# Patient Record
Sex: Female | Born: 1940 | Race: White | Hispanic: No | Smoking: Never smoker
Health system: Southern US, Community
[De-identification: ages and names within clinical notes are randomized; demographics above are authoritative.]

## PROBLEM LIST (undated history)

## (undated) DIAGNOSIS — I38 Endocarditis, valve unspecified: Secondary | ICD-10-CM

## (undated) DIAGNOSIS — C189 Malignant neoplasm of colon, unspecified: Secondary | ICD-10-CM

## (undated) DIAGNOSIS — D329 Benign neoplasm of meninges, unspecified: Secondary | ICD-10-CM

## (undated) DIAGNOSIS — I429 Cardiomyopathy, unspecified: Secondary | ICD-10-CM

## (undated) DIAGNOSIS — K5730 Diverticulosis of large intestine without perforation or abscess without bleeding: Secondary | ICD-10-CM

## (undated) DIAGNOSIS — F32A Depression, unspecified: Secondary | ICD-10-CM

## (undated) DIAGNOSIS — F419 Anxiety disorder, unspecified: Secondary | ICD-10-CM

## (undated) DIAGNOSIS — E039 Hypothyroidism, unspecified: Secondary | ICD-10-CM

## (undated) DIAGNOSIS — I10 Essential (primary) hypertension: Secondary | ICD-10-CM

## (undated) DIAGNOSIS — I5022 Chronic systolic (congestive) heart failure: Secondary | ICD-10-CM

## (undated) DIAGNOSIS — I700 Atherosclerosis of aorta: Secondary | ICD-10-CM

## (undated) DIAGNOSIS — D496 Neoplasm of unspecified behavior of brain: Secondary | ICD-10-CM

## (undated) DIAGNOSIS — I428 Other cardiomyopathies: Secondary | ICD-10-CM

## (undated) DIAGNOSIS — D509 Iron deficiency anemia, unspecified: Secondary | ICD-10-CM

## (undated) DIAGNOSIS — M810 Age-related osteoporosis without current pathological fracture: Secondary | ICD-10-CM

## (undated) DIAGNOSIS — N6019 Diffuse cystic mastopathy of unspecified breast: Secondary | ICD-10-CM

## (undated) DIAGNOSIS — E785 Hyperlipidemia, unspecified: Secondary | ICD-10-CM

## (undated) DIAGNOSIS — J3801 Paralysis of vocal cords and larynx, unilateral: Secondary | ICD-10-CM

## (undated) DIAGNOSIS — K219 Gastro-esophageal reflux disease without esophagitis: Secondary | ICD-10-CM

## (undated) HISTORY — PX: BREAST EXCISIONAL BIOPSY: SUR124

## (undated) HISTORY — PX: COLONOSCOPY: SHX174

## (undated) HISTORY — PX: HEMORROIDECTOMY: SUR656

## (undated) HISTORY — PX: BREAST SURGERY: SHX581

## (undated) HISTORY — PX: CATARACT EXTRACTION, BILATERAL: SHX1313

## (undated) HISTORY — PX: APPENDECTOMY: SHX54

## (undated) HISTORY — PX: EYE SURGERY: SHX253

## (undated) HISTORY — PX: SHOULDER ARTHROSCOPY: SHX128

---

## 1983-01-02 HISTORY — PX: OTHER SURGICAL HISTORY: SHX169

## 2003-12-21 ENCOUNTER — Ambulatory Visit: Payer: Self-pay | Admitting: Internal Medicine

## 2004-01-07 ENCOUNTER — Ambulatory Visit: Payer: Self-pay | Admitting: Internal Medicine

## 2004-04-01 HISTORY — PX: OTHER SURGICAL HISTORY: SHX169

## 2004-08-24 ENCOUNTER — Ambulatory Visit: Payer: Self-pay | Admitting: Otolaryngology

## 2004-12-27 ENCOUNTER — Ambulatory Visit: Payer: Self-pay | Admitting: Internal Medicine

## 2005-06-29 ENCOUNTER — Ambulatory Visit: Payer: Self-pay

## 2005-12-06 ENCOUNTER — Ambulatory Visit: Payer: Self-pay | Admitting: Internal Medicine

## 2006-06-06 ENCOUNTER — Ambulatory Visit: Payer: Self-pay | Admitting: Gastroenterology

## 2006-12-24 ENCOUNTER — Ambulatory Visit: Payer: Self-pay | Admitting: Internal Medicine

## 2007-07-07 ENCOUNTER — Ambulatory Visit: Payer: Self-pay | Admitting: Otolaryngology

## 2008-01-01 ENCOUNTER — Ambulatory Visit: Payer: Self-pay | Admitting: Internal Medicine

## 2009-05-04 ENCOUNTER — Ambulatory Visit: Payer: Self-pay | Admitting: Internal Medicine

## 2009-11-17 ENCOUNTER — Ambulatory Visit: Payer: Self-pay | Admitting: Ophthalmology

## 2009-11-29 ENCOUNTER — Ambulatory Visit: Payer: Self-pay | Admitting: Ophthalmology

## 2010-03-07 ENCOUNTER — Ambulatory Visit: Payer: Self-pay | Admitting: Ophthalmology

## 2010-08-01 ENCOUNTER — Ambulatory Visit: Payer: Self-pay | Admitting: Internal Medicine

## 2011-08-14 ENCOUNTER — Ambulatory Visit: Payer: Self-pay | Admitting: Internal Medicine

## 2012-08-14 ENCOUNTER — Ambulatory Visit: Payer: Self-pay | Admitting: Internal Medicine

## 2012-09-22 ENCOUNTER — Ambulatory Visit: Payer: Self-pay | Admitting: Otolaryngology

## 2013-06-11 DIAGNOSIS — M810 Age-related osteoporosis without current pathological fracture: Secondary | ICD-10-CM | POA: Insufficient documentation

## 2013-07-01 DIAGNOSIS — E785 Hyperlipidemia, unspecified: Secondary | ICD-10-CM | POA: Insufficient documentation

## 2013-08-17 ENCOUNTER — Ambulatory Visit: Payer: Self-pay | Admitting: Internal Medicine

## 2014-01-05 DIAGNOSIS — E038 Other specified hypothyroidism: Secondary | ICD-10-CM | POA: Diagnosis not present

## 2014-01-05 DIAGNOSIS — E034 Atrophy of thyroid (acquired): Secondary | ICD-10-CM | POA: Diagnosis not present

## 2015-03-08 DIAGNOSIS — E7800 Pure hypercholesterolemia, unspecified: Secondary | ICD-10-CM | POA: Diagnosis not present

## 2015-03-08 DIAGNOSIS — E034 Atrophy of thyroid (acquired): Secondary | ICD-10-CM | POA: Diagnosis not present

## 2015-03-15 DIAGNOSIS — Z1231 Encounter for screening mammogram for malignant neoplasm of breast: Secondary | ICD-10-CM | POA: Diagnosis not present

## 2015-03-15 DIAGNOSIS — M810 Age-related osteoporosis without current pathological fracture: Secondary | ICD-10-CM | POA: Diagnosis not present

## 2015-03-15 DIAGNOSIS — Z0001 Encounter for general adult medical examination with abnormal findings: Secondary | ICD-10-CM | POA: Diagnosis not present

## 2015-03-15 DIAGNOSIS — Z8639 Personal history of other endocrine, nutritional and metabolic disease: Secondary | ICD-10-CM | POA: Diagnosis not present

## 2015-03-21 ENCOUNTER — Other Ambulatory Visit: Payer: Self-pay | Admitting: Internal Medicine

## 2015-03-21 DIAGNOSIS — Z1231 Encounter for screening mammogram for malignant neoplasm of breast: Secondary | ICD-10-CM

## 2015-03-29 DIAGNOSIS — M810 Age-related osteoporosis without current pathological fracture: Secondary | ICD-10-CM | POA: Diagnosis not present

## 2015-04-01 ENCOUNTER — Ambulatory Visit
Admission: RE | Admit: 2015-04-01 | Discharge: 2015-04-01 | Disposition: A | Discharge status: Home / Self Care | Payer: Commercial Managed Care - HMO | Source: Ambulatory Visit | Attending: Internal Medicine | Admitting: Internal Medicine

## 2015-04-01 ENCOUNTER — Other Ambulatory Visit: Payer: Self-pay | Admitting: Internal Medicine

## 2015-04-01 DIAGNOSIS — Z1231 Encounter for screening mammogram for malignant neoplasm of breast: Secondary | ICD-10-CM | POA: Diagnosis not present

## 2015-09-08 DIAGNOSIS — Z8639 Personal history of other endocrine, nutritional and metabolic disease: Secondary | ICD-10-CM | POA: Diagnosis not present

## 2015-09-15 DIAGNOSIS — E034 Atrophy of thyroid (acquired): Secondary | ICD-10-CM | POA: Diagnosis not present

## 2015-11-30 DIAGNOSIS — Z0000 Encounter for general adult medical examination without abnormal findings: Secondary | ICD-10-CM | POA: Diagnosis not present

## 2016-03-08 DIAGNOSIS — E034 Atrophy of thyroid (acquired): Secondary | ICD-10-CM | POA: Diagnosis not present

## 2016-03-15 DIAGNOSIS — Z0001 Encounter for general adult medical examination with abnormal findings: Secondary | ICD-10-CM | POA: Diagnosis not present

## 2016-03-15 DIAGNOSIS — E034 Atrophy of thyroid (acquired): Secondary | ICD-10-CM | POA: Diagnosis not present

## 2016-03-15 DIAGNOSIS — Z0000 Encounter for general adult medical examination without abnormal findings: Secondary | ICD-10-CM | POA: Diagnosis not present

## 2016-03-15 DIAGNOSIS — Z1231 Encounter for screening mammogram for malignant neoplasm of breast: Secondary | ICD-10-CM | POA: Diagnosis not present

## 2016-03-15 DIAGNOSIS — E7800 Pure hypercholesterolemia, unspecified: Secondary | ICD-10-CM | POA: Diagnosis not present

## 2016-03-15 DIAGNOSIS — M810 Age-related osteoporosis without current pathological fracture: Secondary | ICD-10-CM | POA: Diagnosis not present

## 2016-04-09 ENCOUNTER — Other Ambulatory Visit: Payer: Self-pay | Admitting: Internal Medicine

## 2016-04-09 DIAGNOSIS — Z1231 Encounter for screening mammogram for malignant neoplasm of breast: Secondary | ICD-10-CM

## 2016-04-11 ENCOUNTER — Ambulatory Visit
Admission: RE | Admit: 2016-04-11 | Discharge: 2016-04-11 | Disposition: A | Discharge status: Home / Self Care | Payer: Commercial Managed Care - HMO | Source: Ambulatory Visit | Attending: Internal Medicine | Admitting: Internal Medicine

## 2016-04-11 DIAGNOSIS — Z1231 Encounter for screening mammogram for malignant neoplasm of breast: Secondary | ICD-10-CM | POA: Diagnosis not present

## 2016-06-28 DIAGNOSIS — H43813 Vitreous degeneration, bilateral: Secondary | ICD-10-CM | POA: Diagnosis not present

## 2016-08-14 DIAGNOSIS — Z23 Encounter for immunization: Secondary | ICD-10-CM | POA: Diagnosis not present

## 2016-08-14 DIAGNOSIS — S61402A Unspecified open wound of left hand, initial encounter: Secondary | ICD-10-CM | POA: Diagnosis not present

## 2016-08-14 DIAGNOSIS — W5501XA Bitten by cat, initial encounter: Secondary | ICD-10-CM | POA: Diagnosis not present

## 2016-08-14 DIAGNOSIS — S81801A Unspecified open wound, right lower leg, initial encounter: Secondary | ICD-10-CM | POA: Diagnosis not present

## 2016-08-31 DIAGNOSIS — R300 Dysuria: Secondary | ICD-10-CM | POA: Diagnosis not present

## 2016-08-31 DIAGNOSIS — R319 Hematuria, unspecified: Secondary | ICD-10-CM | POA: Diagnosis not present

## 2016-08-31 DIAGNOSIS — N390 Urinary tract infection, site not specified: Secondary | ICD-10-CM | POA: Diagnosis not present

## 2016-09-14 DIAGNOSIS — E034 Atrophy of thyroid (acquired): Secondary | ICD-10-CM | POA: Diagnosis not present

## 2016-09-18 DIAGNOSIS — E034 Atrophy of thyroid (acquired): Secondary | ICD-10-CM | POA: Diagnosis not present

## 2017-01-15 DIAGNOSIS — F411 Generalized anxiety disorder: Secondary | ICD-10-CM | POA: Diagnosis not present

## 2017-01-15 DIAGNOSIS — E034 Atrophy of thyroid (acquired): Secondary | ICD-10-CM | POA: Diagnosis not present

## 2017-03-12 DIAGNOSIS — E034 Atrophy of thyroid (acquired): Secondary | ICD-10-CM | POA: Diagnosis not present

## 2017-04-01 DIAGNOSIS — Z5181 Encounter for therapeutic drug level monitoring: Secondary | ICD-10-CM | POA: Diagnosis not present

## 2017-04-01 DIAGNOSIS — Z1231 Encounter for screening mammogram for malignant neoplasm of breast: Secondary | ICD-10-CM | POA: Diagnosis not present

## 2017-04-01 DIAGNOSIS — M810 Age-related osteoporosis without current pathological fracture: Secondary | ICD-10-CM | POA: Diagnosis not present

## 2017-04-01 DIAGNOSIS — E034 Atrophy of thyroid (acquired): Secondary | ICD-10-CM | POA: Diagnosis not present

## 2017-04-01 DIAGNOSIS — Z0001 Encounter for general adult medical examination with abnormal findings: Secondary | ICD-10-CM | POA: Diagnosis not present

## 2017-04-01 DIAGNOSIS — Z0000 Encounter for general adult medical examination without abnormal findings: Secondary | ICD-10-CM | POA: Diagnosis not present

## 2017-04-01 DIAGNOSIS — E7800 Pure hypercholesterolemia, unspecified: Secondary | ICD-10-CM | POA: Diagnosis not present

## 2017-04-01 DIAGNOSIS — F411 Generalized anxiety disorder: Secondary | ICD-10-CM | POA: Diagnosis not present

## 2017-04-15 ENCOUNTER — Other Ambulatory Visit: Payer: Self-pay | Admitting: Internal Medicine

## 2017-04-15 DIAGNOSIS — Z1231 Encounter for screening mammogram for malignant neoplasm of breast: Secondary | ICD-10-CM

## 2017-05-28 ENCOUNTER — Ambulatory Visit
Admission: RE | Admit: 2017-05-28 | Discharge: 2017-05-28 | Disposition: A | Discharge status: Home / Self Care | Payer: Medicare HMO | Source: Ambulatory Visit | Attending: Internal Medicine | Admitting: Internal Medicine

## 2017-05-28 DIAGNOSIS — Z1231 Encounter for screening mammogram for malignant neoplasm of breast: Secondary | ICD-10-CM | POA: Diagnosis not present

## 2017-06-07 DIAGNOSIS — F32A Depression, unspecified: Secondary | ICD-10-CM | POA: Insufficient documentation

## 2017-06-07 DIAGNOSIS — F329 Major depressive disorder, single episode, unspecified: Secondary | ICD-10-CM | POA: Diagnosis not present

## 2017-06-07 DIAGNOSIS — F411 Generalized anxiety disorder: Secondary | ICD-10-CM | POA: Diagnosis not present

## 2017-06-08 ENCOUNTER — Emergency Department
Admission: EM | Admit: 2017-06-08 | Discharge: 2017-06-08 | Disposition: A | Discharge status: Home / Self Care | Payer: Medicare HMO | Attending: Emergency Medicine | Admitting: Emergency Medicine

## 2017-06-08 ENCOUNTER — Encounter: Payer: Self-pay | Admitting: Emergency Medicine

## 2017-06-08 ENCOUNTER — Other Ambulatory Visit: Payer: Self-pay

## 2017-06-08 DIAGNOSIS — R300 Dysuria: Secondary | ICD-10-CM | POA: Diagnosis present

## 2017-06-08 DIAGNOSIS — N3001 Acute cystitis with hematuria: Secondary | ICD-10-CM

## 2017-06-08 LAB — CBC
HCT: 37.9 % (ref 35.0–47.0)
HEMOGLOBIN: 12.9 g/dL (ref 12.0–16.0)
MCH: 32 pg (ref 26.0–34.0)
MCHC: 34 g/dL (ref 32.0–36.0)
MCV: 94 fL (ref 80.0–100.0)
Platelets: 260 10*3/uL (ref 150–440)
RBC: 4.03 MIL/uL (ref 3.80–5.20)
RDW: 13.6 % (ref 11.5–14.5)
WBC: 9.5 10*3/uL (ref 3.6–11.0)

## 2017-06-08 LAB — URINALYSIS, COMPLETE (UACMP) WITH MICROSCOPIC
BILIRUBIN URINE: NEGATIVE
GLUCOSE, UA: NEGATIVE mg/dL
KETONES UR: NEGATIVE mg/dL
NITRITE: NEGATIVE
PH: 5 (ref 5.0–8.0)
Protein, ur: 100 mg/dL — AB
RBC / HPF: >50 RBC/hpf — ABNORMAL HIGH (ref 0–5)
SPECIFIC GRAVITY, URINE: 1.012 (ref 1.005–1.030)
Squamous Epithelial / LPF: NONE SEEN (ref 0–5)

## 2017-06-08 LAB — COMPREHENSIVE METABOLIC PANEL
ALBUMIN: 4.2 g/dL (ref 3.5–5.0)
ALT: 14 U/L (ref 14–54)
ANION GAP: 8 (ref 5–15)
AST: 22 U/L (ref 15–41)
Alkaline Phosphatase: 50 U/L (ref 38–126)
BILIRUBIN TOTAL: 0.6 mg/dL (ref 0.3–1.2)
BUN: 14 mg/dL (ref 6–20)
CO2: 25 mmol/L (ref 22–32)
CREATININE: 0.88 mg/dL (ref 0.44–1.00)
Calcium: 9.1 mg/dL (ref 8.9–10.3)
Chloride: 105 mmol/L (ref 101–111)
GFR calc non Af Amer: >60 mL/min (ref >60)
GLUCOSE: 102 mg/dL — AB (ref 65–99)
Potassium: 4.1 mmol/L (ref 3.5–5.1)
Sodium: 138 mmol/L (ref 135–145)
TOTAL PROTEIN: 7.4 g/dL (ref 6.5–8.1)

## 2017-06-08 LAB — LIPASE, BLOOD: Lipase: 73 U/L — ABNORMAL HIGH (ref 11–51)

## 2017-06-08 MED ORDER — CEPHALEXIN 500 MG PO CAPS: 500.0000 mg | ORAL_CAPSULE | Freq: Four times a day (QID) | ORAL | 0 refills | Status: DC

## 2017-06-08 MED ORDER — PHENAZOPYRIDINE HCL 200 MG PO TABS
ORAL_TABLET | ORAL | Status: AC
  Filled 2017-06-08: qty 1

## 2017-06-08 MED ORDER — PHENAZOPYRIDINE HCL 95 MG PO TABS: 95.0000 mg | ORAL_TABLET | Freq: Three times a day (TID) | ORAL | 0 refills | Status: DC | PRN

## 2017-06-08 MED ORDER — CEPHALEXIN 500 MG PO CAPS
500.0000 mg | ORAL_CAPSULE | Freq: Once | ORAL | Status: AC
  Administered 2017-06-08: 500 mg via ORAL
  Filled 2017-06-08: qty 1

## 2017-06-08 NOTE — ED Triage Notes (Signed)
Urinary frequency and pain x 2 days, took AZO and increased fluids and symptoms decreased however today noted blood in urine and symptoms return.

## 2017-06-08 NOTE — ED Provider Notes (Signed)
[facility] Emergency Department Provider Note  ____________________________________________  Time seen: Approximately 6:22 PM  I have reviewed the triage vital signs and the nursing notes.   HISTORY  Chief Complaint Dysuria    HPI Cynthia Mcgrath is a 77 y.o. female who presents the emergency department complaining of UTI-like symptoms.  Patient presents planing of dysuria.  He states that symptoms began 4 days ago, she took Azo for same with improvement.  Patient reports that she had a visit with her primary care, mention same but was currently asymptomatic when she had seen her primary care provider.  No testing was performed at that time.  Patient had return of symptoms yesterday and start of hematuria today.  Last UTI was 6 months ago and patient cannot remember an biotic choice used.  Patient reports no flank pain, abdominal pain, pelvic pain.  She reports that there is pain only with urination.  Patient denies any other complaints of fevers or chills, chest pain, shortness of breath, abdominal pain, nausea vomiting, diarrhea or constipation.    History reviewed. No pertinent past medical history.  There are no active problems to display for this patient.   Past Surgical History:  Procedure Laterality Date  . BREAST EXCISIONAL BIOPSY Bilateral 1980's   NEG    Prior to Admission medications   Medication Sig Start Date End Date Taking? Authorizing Provider  cephALEXin (KEFLEX) 500 MG capsule Take 1 capsule (500 mg total) by mouth 4 (four) times daily. 06/08/17   Mayco Walrond, Charline Bills, PA-C  phenazopyridine (PYRIDIUM) 95 MG tablet Take 1 tablet (95 mg total) by mouth 3 (three) times daily as needed for pain. 06/08/17   Tyquasia Pant, Charline Bills, PA-C    Allergies Codeine  Family History  Problem Relation Age of Onset  . Breast cancer Cousin 78    Social History Social History   Tobacco Use  . Smoking status: Not on file  Substance Use Topics  .  Alcohol use: Not on file  . Drug use: Not on file     Review of Systems  Constitutional: No fever/chills Eyes: No visual changes. No discharge ENT: No upper respiratory complaints. Cardiovascular: no chest pain. Respiratory: no cough. No SOB. Gastrointestinal: No abdominal pain.  No nausea, no vomiting.  No diarrhea.  No constipation. Genitourinary: Positive for dysuria, polyuria, hematuria. Musculoskeletal: Negative for musculoskeletal pain. Skin: Negative for rash, abrasions, lacerations, ecchymosis. Neurological: Negative for headaches, focal weakness or numbness. 10-point ROS otherwise negative.  ____________________________________________   PHYSICAL EXAM:  VITAL SIGNS: ED Triage Vitals  Enc Vitals Group     BP 06/08/17 1606 121/72     Pulse Rate 06/08/17 1606 (!) 101     Resp 06/08/17 1606 20     Temp 06/08/17 1606 98.1 F (36.7 C)     Temp Source 06/08/17 1606 Oral     SpO2 06/08/17 1606 96 %     Weight 06/08/17 1607 130 lb (59 kg)     Height 06/08/17 1607 5' (1.524 m)     Head Circumference --      Peak Flow --      Pain Score 06/08/17 1606 0     Pain Loc --      Pain Edu? --      Excl. in [place]? --      Constitutional: Alert and oriented. Well appearing and in no acute distress. Eyes: Conjunctivae are normal. PERRL. EOMI. Head: Atraumatic. Neck: No stridor.    Cardiovascular: Normal rate,  regular rhythm. Normal S1 and S2.  Good peripheral circulation. Respiratory: Normal respiratory effort without tachypnea or retractions. Lungs CTAB. Good air entry to the bases with no decreased or absent breath sounds. Gastrointestinal: Bowel sounds 4 quadrants. Soft and nontender to palpation all 4 quadrants and suprapubic region.. No guarding or rigidity. No palpable masses. No distention. No CVA tenderness. Musculoskeletal: Full range of motion to all extremities. No gross deformities appreciated. Neurologic:  Normal speech and language. No gross focal neurologic  deficits are appreciated.  Skin:  Skin is warm, dry and intact. No rash noted. Psychiatric: Mood and affect are normal. Speech and behavior are normal. Patient exhibits appropriate insight and judgement.   ____________________________________________   LABS (all labs ordered are listed, but only abnormal results are displayed)  Labs Reviewed  URINALYSIS, COMPLETE (UACMP) WITH MICROSCOPIC - Abnormal; Notable for the following components:      Result Value   Color, Urine YELLOW (*)    APPearance CLOUDY (*)    Hgb urine dipstick LARGE (*)    Protein, ur 100 (*)    Leukocytes, UA LARGE (*)    RBC / HPF >50 (*)    WBC, UA >50 (*)    Bacteria, UA RARE (*)    Non Squamous Epithelial 0-5 (*)    All other components within normal limits  LIPASE, BLOOD - Abnormal; Notable for the following components:   Lipase 73 (*)    All other components within normal limits  COMPREHENSIVE METABOLIC PANEL - Abnormal; Notable for the following components:   Glucose, Bld 102 (*)    All other components within normal limits  URINE CULTURE  CBC   ____________________________________________  EKG   ____________________________________________  RADIOLOGY   No results found.  ____________________________________________    PROCEDURES  Procedure(s) performed:    Procedures    Medications  cephALEXin (KEFLEX) capsule 500 mg (has no administration in time range)     ____________________________________________   INITIAL IMPRESSION / ASSESSMENT AND PLAN / ED COURSE  Pertinent labs & imaging results that were available during my care of the patient were reviewed by me and considered in my medical decision making (see chart for details).  Review of the [place] CSRS was performed in accordance of the [place] prior to dispensing any controlled drugs.      Patient's diagnosis is consistent with UTI with hematuria.  Patient presents emergency department complaining of dysuria, polyuria,  hematuria.  Patient denies any flank pain, history of nephrolithiasis.  Patient reports that Azo improved her symptoms somewhat.  Exam is reassuring at this time.  Patient's urinalysis did have blood, as well as white blood cells.  After lengthy discussion, patient opts for treatment with oral antibiotics and see if symptoms improve.  If patient continues to have hematuria, she will follow-up with primary care for further investigation.  Patient's labs were reassuring.  At this time, patient will be discharged with Keflex, Pyridium for symptom improvement.  If symptoms do not improve she will follow-up with primary care for further management. Patient is given ED precautions to return to the ED for any worsening or new symptoms.     ____________________________________________  FINAL CLINICAL IMPRESSION(S) / ED DIAGNOSES  Final diagnoses:  Acute cystitis with hematuria      NEW MEDICATIONS STARTED DURING THIS VISIT:  ED Discharge Orders        Ordered    cephALEXin (KEFLEX) 500 MG capsule  4 times daily     06/08/17 1823  phenazopyridine (PYRIDIUM) 95 MG tablet  3 times daily PRN     06/08/17 1823          This chart was dictated using voice recognition software/Dragon. Despite best efforts to proofread, errors can occur which can change the meaning. Any change was purely unintentional.    Darletta Moll, PA-C 06/08/17 1826    Nena Polio, MD 06/09/17 [phone]

## 2017-06-08 NOTE — ED Notes (Signed)
PA in to assess before RN. Family with patient at bedside.

## 2017-06-11 LAB — URINE CULTURE
Culture: 80000 — AB
Special Requests: NORMAL

## 2017-09-30 DIAGNOSIS — E034 Atrophy of thyroid (acquired): Secondary | ICD-10-CM | POA: Diagnosis not present

## 2017-09-30 DIAGNOSIS — Z5181 Encounter for therapeutic drug level monitoring: Secondary | ICD-10-CM | POA: Diagnosis not present

## 2017-10-02 DIAGNOSIS — M810 Age-related osteoporosis without current pathological fracture: Secondary | ICD-10-CM | POA: Diagnosis not present

## 2017-10-02 DIAGNOSIS — F329 Major depressive disorder, single episode, unspecified: Secondary | ICD-10-CM | POA: Diagnosis not present

## 2017-10-02 DIAGNOSIS — Z23 Encounter for immunization: Secondary | ICD-10-CM | POA: Diagnosis not present

## 2017-10-02 DIAGNOSIS — F411 Generalized anxiety disorder: Secondary | ICD-10-CM | POA: Diagnosis not present

## 2017-10-02 DIAGNOSIS — E7800 Pure hypercholesterolemia, unspecified: Secondary | ICD-10-CM | POA: Diagnosis not present

## 2017-10-02 DIAGNOSIS — E034 Atrophy of thyroid (acquired): Secondary | ICD-10-CM | POA: Diagnosis not present

## 2017-11-08 DIAGNOSIS — F329 Major depressive disorder, single episode, unspecified: Secondary | ICD-10-CM | POA: Diagnosis not present

## 2017-11-08 DIAGNOSIS — F411 Generalized anxiety disorder: Secondary | ICD-10-CM | POA: Diagnosis not present

## 2017-12-03 DIAGNOSIS — F329 Major depressive disorder, single episode, unspecified: Secondary | ICD-10-CM | POA: Diagnosis not present

## 2017-12-03 DIAGNOSIS — M810 Age-related osteoporosis without current pathological fracture: Secondary | ICD-10-CM | POA: Diagnosis not present

## 2017-12-03 DIAGNOSIS — E034 Atrophy of thyroid (acquired): Secondary | ICD-10-CM | POA: Diagnosis not present

## 2017-12-03 DIAGNOSIS — E7800 Pure hypercholesterolemia, unspecified: Secondary | ICD-10-CM | POA: Diagnosis not present

## 2017-12-03 DIAGNOSIS — F411 Generalized anxiety disorder: Secondary | ICD-10-CM | POA: Diagnosis not present

## 2018-01-12 DIAGNOSIS — S2232XA Fracture of one rib, left side, initial encounter for closed fracture: Secondary | ICD-10-CM | POA: Diagnosis not present

## 2018-05-12 DIAGNOSIS — R399 Unspecified symptoms and signs involving the genitourinary system: Secondary | ICD-10-CM | POA: Diagnosis not present

## 2018-05-30 DIAGNOSIS — H35373 Puckering of macula, bilateral: Secondary | ICD-10-CM | POA: Diagnosis not present

## 2018-06-05 DIAGNOSIS — E7800 Pure hypercholesterolemia, unspecified: Secondary | ICD-10-CM | POA: Diagnosis not present

## 2018-06-05 DIAGNOSIS — F329 Major depressive disorder, single episode, unspecified: Secondary | ICD-10-CM | POA: Diagnosis not present

## 2018-06-05 DIAGNOSIS — E034 Atrophy of thyroid (acquired): Secondary | ICD-10-CM | POA: Diagnosis not present

## 2018-06-05 DIAGNOSIS — M810 Age-related osteoporosis without current pathological fracture: Secondary | ICD-10-CM | POA: Diagnosis not present

## 2018-06-05 DIAGNOSIS — F411 Generalized anxiety disorder: Secondary | ICD-10-CM | POA: Diagnosis not present

## 2018-06-30 ENCOUNTER — Emergency Department
Admission: EM | Admit: 2018-06-30 | Discharge: 2018-06-30 | Disposition: A | Discharge status: Home / Self Care | Payer: Medicare HMO | Attending: Student in an Organized Health Care Education/Training Program | Admitting: Student in an Organized Health Care Education/Training Program

## 2018-06-30 ENCOUNTER — Emergency Department: Payer: Medicare HMO

## 2018-06-30 ENCOUNTER — Other Ambulatory Visit: Payer: Self-pay

## 2018-06-30 ENCOUNTER — Encounter: Payer: Self-pay | Admitting: Emergency Medicine

## 2018-06-30 DIAGNOSIS — S63519A Sprain of carpal joint of unspecified wrist, initial encounter: Secondary | ICD-10-CM | POA: Diagnosis not present

## 2018-06-30 DIAGNOSIS — S0990XA Unspecified injury of head, initial encounter: Secondary | ICD-10-CM | POA: Diagnosis not present

## 2018-06-30 DIAGNOSIS — S59902A Unspecified injury of left elbow, initial encounter: Secondary | ICD-10-CM | POA: Diagnosis not present

## 2018-06-30 DIAGNOSIS — Y9389 Activity, other specified: Secondary | ICD-10-CM | POA: Insufficient documentation

## 2018-06-30 DIAGNOSIS — S199XXA Unspecified injury of neck, initial encounter: Secondary | ICD-10-CM | POA: Diagnosis not present

## 2018-06-30 DIAGNOSIS — S52592A Other fractures of lower end of left radius, initial encounter for closed fracture: Secondary | ICD-10-CM | POA: Diagnosis not present

## 2018-06-30 DIAGNOSIS — Y92008 Other place in unspecified non-institutional (private) residence as the place of occurrence of the external cause: Secondary | ICD-10-CM | POA: Diagnosis not present

## 2018-06-30 DIAGNOSIS — W109XXA Fall (on) (from) unspecified stairs and steps, initial encounter: Secondary | ICD-10-CM | POA: Insufficient documentation

## 2018-06-30 DIAGNOSIS — S52502A Unspecified fracture of the lower end of left radius, initial encounter for closed fracture: Secondary | ICD-10-CM | POA: Diagnosis not present

## 2018-06-30 DIAGNOSIS — M25332 Other instability, left wrist: Secondary | ICD-10-CM

## 2018-06-30 DIAGNOSIS — S59912A Unspecified injury of left forearm, initial encounter: Secondary | ICD-10-CM | POA: Diagnosis present

## 2018-06-30 DIAGNOSIS — Y999 Unspecified external cause status: Secondary | ICD-10-CM | POA: Diagnosis not present

## 2018-06-30 DIAGNOSIS — Z79899 Other long term (current) drug therapy: Secondary | ICD-10-CM | POA: Insufficient documentation

## 2018-06-30 DIAGNOSIS — S63002A Unspecified subluxation of left wrist and hand, initial encounter: Secondary | ICD-10-CM | POA: Diagnosis not present

## 2018-06-30 DIAGNOSIS — W19XXXA Unspecified fall, initial encounter: Secondary | ICD-10-CM

## 2018-06-30 MED ORDER — HYDROCODONE-ACETAMINOPHEN 5-325 MG PO TABS: 1.0000 | ORAL_TABLET | ORAL | 0 refills | Status: DC | PRN

## 2018-06-30 MED ORDER — OXYCODONE-ACETAMINOPHEN 5-325 MG PO TABS
1.0000 | ORAL_TABLET | Freq: Once | ORAL | Status: AC
  Administered 2018-06-30: 1 via ORAL
  Filled 2018-06-30: qty 1

## 2018-06-30 NOTE — ED Provider Notes (Addendum)
[facility] Emergency Department Provider Note  ____________________________________________  Time seen: Approximately 9:11 PM  I have reviewed the triage vital signs and the nursing notes.   HISTORY  Chief Complaint Fall    HPI Cynthia Mcgrath is a 78 y.o. female who presents the emergency department after sustaining a fall at home.  Patient was on her back step trying to clean when she lost balance, falling backward.  Patient tried to catch herself on her outstretched left wrist/hand.  Patient did strike her head on the cement.  She denied lose consciousness.  She is complaining primarily of left wrist pain.  No medications prior to arrival.  Patient does have the wrist on an ice bag.  No visual changes, neck pain.  No radicular symptoms.  No subsequent loss of consciousness.         History reviewed. No pertinent past medical history.  There are no active problems to display for this patient.   Past Surgical History:  Procedure Laterality Date  . BREAST EXCISIONAL BIOPSY Bilateral 1980's   NEG    Prior to Admission medications   Medication Sig Start Date End Date Taking? Authorizing Provider  cephALEXin (KEFLEX) 500 MG capsule Take 1 capsule (500 mg total) by mouth 4 (four) times daily. 06/08/17   Teagan Heidrick, Charline Bills, PA-C  HYDROcodone-acetaminophen (NORCO/VICODIN) 5-325 MG tablet Take 1 tablet by mouth every 4 (four) hours as needed for moderate pain. 06/30/18   Keniesha Adderly, Charline Bills, PA-C  phenazopyridine (PYRIDIUM) 95 MG tablet Take 1 tablet (95 mg total) by mouth 3 (three) times daily as needed for pain. 06/08/17   Harless Molinari, Charline Bills, PA-C    Allergies Codeine  Family History  Problem Relation Age of Onset  . Breast cancer Cousin 32    Social History Social History   Tobacco Use  . Smoking status: Not on file  Substance Use Topics  . Alcohol use: Not on file  . Drug use: Not on file     Review of Systems  Constitutional: No  fever/chills.  Patient did hit her head but did not lose consciousness. Eyes: No visual changes. No discharge ENT: No upper respiratory complaints. Cardiovascular: no chest pain. Respiratory: no cough. No SOB. Gastrointestinal: No abdominal pain.  No nausea, no vomiting.  No diarrhea.  No constipation. Musculoskeletal: Positive for left wrist and elbow pain Skin: Negative for rash, abrasions, lacerations, ecchymosis. Neurological: Negative for headaches, focal weakness or numbness. 10-point ROS otherwise negative.  ____________________________________________   PHYSICAL EXAM:  VITAL SIGNS: ED Triage Vitals  Enc Vitals Group     BP 06/30/18 1838 (!) 142/77     Pulse Rate 06/30/18 1838 90     Resp 06/30/18 1838 16     Temp 06/30/18 1838 98.8 F (37.1 C)     Temp Source 06/30/18 1838 Oral     SpO2 06/30/18 1838 95 %     Weight 06/30/18 1839 127 lb (57.6 kg)     Height 06/30/18 1839 5' (1.524 m)     Head Circumference --      Peak Flow --      Pain Score 06/30/18 1839 7     Pain Loc --      Pain Edu? --      Excl. in [place]? --      Constitutional: Alert and oriented. Well appearing and in no acute distress. Eyes: Conjunctivae are normal. PERRL. EOMI. Head: Atraumatic.  No visible signs of trauma to the skull or  face.  No abrasions, lacerations, hematomas.  Nontender to palpation of the osseous structures of the skull and face.  No battle signs, raccoon eyes, serosanguineous fluid drainage from the ears or nares. ENT:      Ears:       Nose: No congestion/rhinnorhea.      Mouth/Throat: Mucous membranes are moist.  Neck: No stridor.    Cardiovascular: Normal rate, regular rhythm. Normal S1 and S2.  Good peripheral circulation. Respiratory: Normal respiratory effort without tachypnea or retractions. Lungs CTAB. Good air entry to the bases with no decreased or absent breath sounds. Musculoskeletal: Full range of motion to all extremities. No gross deformities appreciated.   Visualization of the left wrist reveals mild edema.  No gross deformity.  No open wounds.  Limited range of motion due to pain.  Patient is very tender to palpation along the proximal carpal bones as well as both the radius and ulna.  No palpable abnormality or deficit.  Radial pulse intact.  Sensation intact all 5 digits.  Examination of the left elbow reveals a superficial skin tear.  No active bleeding.  No foreign body.  Patient is able to extend and flex the elbow appropriately.  Given the pain in the wrist she is having limited supination and pronation.  Examination of the shoulder is unremarkable. Neurologic:  Normal speech and language. No gross focal neurologic deficits are appreciated.  Skin:  Skin is warm, dry and intact. No rash noted. Psychiatric: Mood and affect are normal. Speech and behavior are normal. Patient exhibits appropriate insight and judgement.   ____________________________________________   LABS (all labs ordered are listed, but only abnormal results are displayed)  Labs Reviewed - No data to display ____________________________________________  EKG   ____________________________________________  RADIOLOGY I personally viewed and evaluated these images as part of my medical decision making, as well as reviewing the written report by the radiologist.  Dg Elbow Complete Left  Result Date: 06/30/2018 CLINICAL DATA:  77 year old female status post fall from stool at 1730 hours today. EXAM: LEFT ELBOW - COMPLETE 3+ VIEW COMPARISON:  None. FINDINGS: Bone mineralization is within normal limits for age. Joint spaces and alignment preserved. Small chronic appearing medial and lateral epicondyle spurring or degenerative fragments. No joint effusion is evident. The radial head appears intact. No acute osseous abnormality identified. No discrete soft tissue injury. IMPRESSION: No acute fracture or dislocation identified about the left elbow. Electronically Signed   By: Genevie Ann  M.D.   On: 06/30/2018 19:08   Dg Wrist Complete Left  Result Date: 06/30/2018 CLINICAL DATA:  78 year old female status post fall from stool at 1730 hours today. EXAM: LEFT WRIST - COMPLETE 3+ VIEW COMPARISON:  Left elbow series today. FINDINGS: Impacted mildly comminuted fracture of the distal left radius. The radiocarpal joint appears spared. The DRU is affected. There is mild impaction, minimal dorsal displacement. The carpal bones appear intact but there is scapholunate interval widening. The visible metacarpals appear intact. The distal ulna appears intact. IMPRESSION: 1. Mildly impacted and comminuted fracture of the distal left radius with DRU involvement. 2. Scapholunate interval widening suspicious for ligamentous injury. This can predispose to [facility] wrist. Electronically Signed   By: Genevie Ann M.D.   On: 06/30/2018 19:10   Ct Head Wo Contrast  Result Date: 06/30/2018 CLINICAL DATA:  Fall, head injury EXAM: CT HEAD WITHOUT CONTRAST CT CERVICAL SPINE WITHOUT CONTRAST TECHNIQUE: Multidetector CT imaging of the head and cervical spine was performed following the standard protocol without  intravenous contrast. Multiplanar CT image reconstructions of the cervical spine were also generated. COMPARISON:  MRI head 09/22/2012 FINDINGS: CT HEAD FINDINGS Brain: Ventricle size normal. Negative for hemorrhage, infarct, or mass. No fluid collection or midline shift. Vascular: Negative for hyperdense vessel Skull: Right occipital craniotomy.  No acute skull abnormality Sinuses/Orbits: Paranasal sinuses clear.  Bilateral cataract surgery Other: None CT CERVICAL SPINE FINDINGS Alignment: Mild anterolisthesis negative for fracture C3-4 Skull base and vertebrae: Negative for fracture. Soft tissues and spinal canal: Negative for mass or soft tissue edema. Disc levels: Mild disc degeneration C4-5 and C5-6. Mild facet degeneration bilaterally. Upper chest: Lung apices clear bilaterally. Other: None IMPRESSION: 1. No acute  intracranial abnormality 2. Negative for cervical spine fracture. Electronically Signed   By: Franchot Gallo M.D.   On: 06/30/2018 19:19   Ct Cervical Spine Wo Contrast  Result Date: 06/30/2018 CLINICAL DATA:  Fall, head injury EXAM: CT HEAD WITHOUT CONTRAST CT CERVICAL SPINE WITHOUT CONTRAST TECHNIQUE: Multidetector CT imaging of the head and cervical spine was performed following the standard protocol without intravenous contrast. Multiplanar CT image reconstructions of the cervical spine were also generated. COMPARISON:  MRI head 09/22/2012 FINDINGS: CT HEAD FINDINGS Brain: Ventricle size normal. Negative for hemorrhage, infarct, or mass. No fluid collection or midline shift. Vascular: Negative for hyperdense vessel Skull: Right occipital craniotomy.  No acute skull abnormality Sinuses/Orbits: Paranasal sinuses clear.  Bilateral cataract surgery Other: None CT CERVICAL SPINE FINDINGS Alignment: Mild anterolisthesis negative for fracture C3-4 Skull base and vertebrae: Negative for fracture. Soft tissues and spinal canal: Negative for mass or soft tissue edema. Disc levels: Mild disc degeneration C4-5 and C5-6. Mild facet degeneration bilaterally. Upper chest: Lung apices clear bilaterally. Other: None IMPRESSION: 1. No acute intracranial abnormality 2. Negative for cervical spine fracture. Electronically Signed   By: Franchot Gallo M.D.   On: 06/30/2018 19:19    ____________________________________________    PROCEDURES  Procedure(s) performed:    .Splint Application  Date/Time: 06/30/2018 9:31 PM Performed by: Darletta Moll, PA-C Authorized by: Darletta Moll, PA-C   Consent:    Consent obtained:  Verbal   Consent given by:  Patient   Risks discussed:  Pain and swelling Pre-procedure details:    Sensation:  Normal Procedure details:    Laterality:  Left   Location:  Wrist   Wrist:  L wrist   Splint type:  Wrist   Supplies:  Cotton padding, Ortho-Glass and elastic  bandage Post-procedure details:    Pain:  Improved   Sensation:  Normal   Patient tolerance of procedure:  Tolerated well, no immediate complications      Medications  oxyCODONE-acetaminophen (PERCOCET/ROXICET) 5-325 MG per tablet 1 tablet (has no administration in time range)     ____________________________________________   INITIAL IMPRESSION / ASSESSMENT AND PLAN / ED COURSE  Pertinent labs & imaging results that were available during my care of the patient were reviewed by me and considered in my medical decision making (see chart for details).  Review of the [place] CSRS was performed in accordance of the [place] prior to dispensing any controlled drugs.           Patient's diagnosis is consistent with fall resulting in distal radius fracture and scapholunate disassociation.  Patient presented to the emergency department complaining of primarily wrist and elbow pain after a fall.  Patient did hit her head and so imaging was obtained.  CT scans were reassuring with no acute findings.  X-ray of the left  wrist reveals distal radius fracture with widening of the scapholunate joint.  At this time, patient's wrist is splinted, pain medication will be prescribed for the patient.  She is to follow-up with orthopedics for further evaluation and management of left wrist injury..  Patient is given ED precautions to return to the ED for any worsening or new symptoms.     ____________________________________________  FINAL CLINICAL IMPRESSION(S) / ED DIAGNOSES  Final diagnoses:  Scapholunate dissociation of left wrist  Fall, initial encounter  Other closed fracture of distal end of left radius, initial encounter      NEW MEDICATIONS STARTED DURING THIS VISIT:  ED Discharge Orders         Ordered    HYDROcodone-acetaminophen (NORCO/VICODIN) 5-325 MG tablet  Every 4 hours PRN     06/30/18 2130              This chart was dictated using voice recognition software/Dragon.  Despite best efforts to proofread, errors can occur which can change the meaning. Any change was purely unintentional.    Darletta Moll, PA-C 06/30/18 2131    Mindy Behnken, Charline Bills, PA-C 06/30/18 2132    Merlyn Lot, MD 06/30/18 2138

## 2018-06-30 NOTE — ED Triage Notes (Addendum)
PT arrives post mechanical fall off of stool at 1730 today. Pt reports she did hit her head and reports a "brief loc." Pt a&o x 4 with no deficits in triage. Pt reports pain to her left wrist. Ice applied to left arm.

## 2018-07-02 DIAGNOSIS — S52502A Unspecified fracture of the lower end of left radius, initial encounter for closed fracture: Secondary | ICD-10-CM | POA: Diagnosis not present

## 2018-07-02 DIAGNOSIS — W07XXXA Fall from chair, initial encounter: Secondary | ICD-10-CM | POA: Diagnosis not present

## 2018-07-08 ENCOUNTER — Other Ambulatory Visit: Payer: Self-pay | Admitting: Internal Medicine

## 2018-07-08 DIAGNOSIS — Z1231 Encounter for screening mammogram for malignant neoplasm of breast: Secondary | ICD-10-CM

## 2018-07-14 DIAGNOSIS — S52502A Unspecified fracture of the lower end of left radius, initial encounter for closed fracture: Secondary | ICD-10-CM | POA: Diagnosis not present

## 2018-07-30 DIAGNOSIS — S52502A Unspecified fracture of the lower end of left radius, initial encounter for closed fracture: Secondary | ICD-10-CM | POA: Diagnosis not present

## 2018-08-05 DIAGNOSIS — R69 Illness, unspecified: Secondary | ICD-10-CM | POA: Diagnosis not present

## 2018-08-13 ENCOUNTER — Other Ambulatory Visit: Payer: Self-pay

## 2018-08-13 ENCOUNTER — Ambulatory Visit
Admission: RE | Admit: 2018-08-13 | Discharge: 2018-08-13 | Disposition: A | Discharge status: Home / Self Care | Payer: Medicare HMO | Source: Ambulatory Visit | Attending: Internal Medicine | Admitting: Internal Medicine

## 2018-08-13 DIAGNOSIS — Z1231 Encounter for screening mammogram for malignant neoplasm of breast: Secondary | ICD-10-CM | POA: Diagnosis not present

## 2018-08-13 DIAGNOSIS — S52502A Unspecified fracture of the lower end of left radius, initial encounter for closed fracture: Secondary | ICD-10-CM | POA: Diagnosis not present

## 2018-09-10 DIAGNOSIS — E034 Atrophy of thyroid (acquired): Secondary | ICD-10-CM | POA: Diagnosis not present

## 2018-09-10 DIAGNOSIS — S52502D Unspecified fracture of the lower end of left radius, subsequent encounter for closed fracture with routine healing: Secondary | ICD-10-CM | POA: Diagnosis not present

## 2018-09-15 DIAGNOSIS — M810 Age-related osteoporosis without current pathological fracture: Secondary | ICD-10-CM | POA: Diagnosis not present

## 2018-09-15 DIAGNOSIS — E034 Atrophy of thyroid (acquired): Secondary | ICD-10-CM | POA: Diagnosis not present

## 2018-09-15 DIAGNOSIS — Z79899 Other long term (current) drug therapy: Secondary | ICD-10-CM | POA: Diagnosis not present

## 2018-09-15 DIAGNOSIS — F411 Generalized anxiety disorder: Secondary | ICD-10-CM | POA: Diagnosis not present

## 2018-09-15 DIAGNOSIS — E7800 Pure hypercholesterolemia, unspecified: Secondary | ICD-10-CM | POA: Diagnosis not present

## 2018-09-15 DIAGNOSIS — Z0000 Encounter for general adult medical examination without abnormal findings: Secondary | ICD-10-CM | POA: Diagnosis not present

## 2018-09-15 DIAGNOSIS — F329 Major depressive disorder, single episode, unspecified: Secondary | ICD-10-CM | POA: Diagnosis not present

## 2018-10-09 ENCOUNTER — Ambulatory Visit: Payer: Self-pay | Admitting: Psychiatry

## 2018-12-29 DIAGNOSIS — Z23 Encounter for immunization: Secondary | ICD-10-CM | POA: Diagnosis not present

## 2018-12-29 DIAGNOSIS — F411 Generalized anxiety disorder: Secondary | ICD-10-CM | POA: Diagnosis not present

## 2018-12-29 DIAGNOSIS — F329 Major depressive disorder, single episode, unspecified: Secondary | ICD-10-CM | POA: Diagnosis not present

## 2019-03-25 DIAGNOSIS — E034 Atrophy of thyroid (acquired): Secondary | ICD-10-CM | POA: Diagnosis not present

## 2019-03-25 DIAGNOSIS — E7800 Pure hypercholesterolemia, unspecified: Secondary | ICD-10-CM | POA: Diagnosis not present

## 2019-04-01 DIAGNOSIS — F329 Major depressive disorder, single episode, unspecified: Secondary | ICD-10-CM | POA: Diagnosis not present

## 2019-04-01 DIAGNOSIS — E034 Atrophy of thyroid (acquired): Secondary | ICD-10-CM | POA: Diagnosis not present

## 2019-04-01 DIAGNOSIS — M810 Age-related osteoporosis without current pathological fracture: Secondary | ICD-10-CM | POA: Diagnosis not present

## 2019-04-01 DIAGNOSIS — F411 Generalized anxiety disorder: Secondary | ICD-10-CM | POA: Diagnosis not present

## 2019-04-01 DIAGNOSIS — Z0001 Encounter for general adult medical examination with abnormal findings: Secondary | ICD-10-CM | POA: Diagnosis not present

## 2019-06-22 DIAGNOSIS — E034 Atrophy of thyroid (acquired): Secondary | ICD-10-CM | POA: Diagnosis not present

## 2019-06-29 DIAGNOSIS — F329 Major depressive disorder, single episode, unspecified: Secondary | ICD-10-CM | POA: Diagnosis not present

## 2019-06-29 DIAGNOSIS — F411 Generalized anxiety disorder: Secondary | ICD-10-CM | POA: Diagnosis not present

## 2019-06-29 DIAGNOSIS — E034 Atrophy of thyroid (acquired): Secondary | ICD-10-CM | POA: Diagnosis not present

## 2019-06-29 DIAGNOSIS — M810 Age-related osteoporosis without current pathological fracture: Secondary | ICD-10-CM | POA: Diagnosis not present

## 2019-06-29 DIAGNOSIS — E7800 Pure hypercholesterolemia, unspecified: Secondary | ICD-10-CM | POA: Diagnosis not present

## 2019-07-17 ENCOUNTER — Other Ambulatory Visit: Payer: Self-pay | Admitting: Internal Medicine

## 2019-07-17 DIAGNOSIS — Z1231 Encounter for screening mammogram for malignant neoplasm of breast: Secondary | ICD-10-CM

## 2019-08-17 ENCOUNTER — Ambulatory Visit
Admission: RE | Admit: 2019-08-17 | Discharge: 2019-08-17 | Disposition: A | Discharge status: Home / Self Care | Payer: Medicare HMO | Source: Ambulatory Visit | Attending: Internal Medicine | Admitting: Internal Medicine

## 2019-08-17 ENCOUNTER — Other Ambulatory Visit: Payer: Self-pay

## 2019-08-17 DIAGNOSIS — Z1231 Encounter for screening mammogram for malignant neoplasm of breast: Secondary | ICD-10-CM | POA: Insufficient documentation

## 2019-09-17 DIAGNOSIS — H35373 Puckering of macula, bilateral: Secondary | ICD-10-CM | POA: Diagnosis not present

## 2019-10-08 DIAGNOSIS — R0600 Dyspnea, unspecified: Secondary | ICD-10-CM | POA: Diagnosis not present

## 2019-10-08 DIAGNOSIS — R531 Weakness: Secondary | ICD-10-CM | POA: Diagnosis not present

## 2019-10-09 ENCOUNTER — Other Ambulatory Visit (HOSPITAL_COMMUNITY): Payer: Self-pay | Admitting: Internal Medicine

## 2019-10-09 ENCOUNTER — Other Ambulatory Visit: Payer: Self-pay | Admitting: Internal Medicine

## 2019-10-09 DIAGNOSIS — R531 Weakness: Secondary | ICD-10-CM

## 2019-10-16 ENCOUNTER — Other Ambulatory Visit: Payer: Self-pay

## 2019-10-16 ENCOUNTER — Ambulatory Visit
Admission: RE | Admit: 2019-10-16 | Discharge: 2019-10-16 | Disposition: A | Discharge status: Home / Self Care | Payer: Medicare HMO | Source: Ambulatory Visit | Attending: Internal Medicine | Admitting: Internal Medicine

## 2019-10-16 DIAGNOSIS — R531 Weakness: Secondary | ICD-10-CM | POA: Insufficient documentation

## 2019-10-16 DIAGNOSIS — G9389 Other specified disorders of brain: Secondary | ICD-10-CM | POA: Diagnosis not present

## 2019-10-16 DIAGNOSIS — G319 Degenerative disease of nervous system, unspecified: Secondary | ICD-10-CM | POA: Diagnosis not present

## 2019-10-29 DIAGNOSIS — R531 Weakness: Secondary | ICD-10-CM | POA: Diagnosis not present

## 2019-10-29 DIAGNOSIS — E034 Atrophy of thyroid (acquired): Secondary | ICD-10-CM | POA: Diagnosis not present

## 2019-10-29 DIAGNOSIS — R0600 Dyspnea, unspecified: Secondary | ICD-10-CM | POA: Diagnosis not present

## 2019-11-04 DIAGNOSIS — R0600 Dyspnea, unspecified: Secondary | ICD-10-CM | POA: Diagnosis not present

## 2019-11-10 DIAGNOSIS — R0600 Dyspnea, unspecified: Secondary | ICD-10-CM | POA: Diagnosis not present

## 2019-11-10 DIAGNOSIS — I38 Endocarditis, valve unspecified: Secondary | ICD-10-CM | POA: Diagnosis not present

## 2019-11-10 DIAGNOSIS — I429 Cardiomyopathy, unspecified: Secondary | ICD-10-CM | POA: Insufficient documentation

## 2019-11-10 DIAGNOSIS — I428 Other cardiomyopathies: Secondary | ICD-10-CM | POA: Diagnosis not present

## 2019-11-10 DIAGNOSIS — E7800 Pure hypercholesterolemia, unspecified: Secondary | ICD-10-CM | POA: Diagnosis not present

## 2019-11-19 DIAGNOSIS — F329 Major depressive disorder, single episode, unspecified: Secondary | ICD-10-CM | POA: Diagnosis not present

## 2019-11-19 DIAGNOSIS — I110 Hypertensive heart disease with heart failure: Secondary | ICD-10-CM | POA: Diagnosis not present

## 2019-11-19 DIAGNOSIS — F419 Anxiety disorder, unspecified: Secondary | ICD-10-CM | POA: Diagnosis not present

## 2019-11-19 DIAGNOSIS — M810 Age-related osteoporosis without current pathological fracture: Secondary | ICD-10-CM | POA: Diagnosis not present

## 2019-11-19 DIAGNOSIS — I429 Cardiomyopathy, unspecified: Secondary | ICD-10-CM | POA: Diagnosis not present

## 2019-11-19 DIAGNOSIS — E7800 Pure hypercholesterolemia, unspecified: Secondary | ICD-10-CM | POA: Diagnosis not present

## 2019-11-19 DIAGNOSIS — I38 Endocarditis, valve unspecified: Secondary | ICD-10-CM | POA: Diagnosis not present

## 2019-11-19 DIAGNOSIS — E039 Hypothyroidism, unspecified: Secondary | ICD-10-CM | POA: Diagnosis not present

## 2019-11-19 DIAGNOSIS — I5020 Unspecified systolic (congestive) heart failure: Secondary | ICD-10-CM | POA: Diagnosis not present

## 2019-11-23 DIAGNOSIS — I429 Cardiomyopathy, unspecified: Secondary | ICD-10-CM | POA: Diagnosis not present

## 2019-11-23 DIAGNOSIS — E7800 Pure hypercholesterolemia, unspecified: Secondary | ICD-10-CM | POA: Diagnosis not present

## 2019-11-23 DIAGNOSIS — I5020 Unspecified systolic (congestive) heart failure: Secondary | ICD-10-CM | POA: Diagnosis not present

## 2019-11-23 DIAGNOSIS — I38 Endocarditis, valve unspecified: Secondary | ICD-10-CM | POA: Diagnosis not present

## 2019-11-23 DIAGNOSIS — F419 Anxiety disorder, unspecified: Secondary | ICD-10-CM | POA: Diagnosis not present

## 2019-11-23 DIAGNOSIS — F329 Major depressive disorder, single episode, unspecified: Secondary | ICD-10-CM | POA: Diagnosis not present

## 2019-11-23 DIAGNOSIS — M810 Age-related osteoporosis without current pathological fracture: Secondary | ICD-10-CM | POA: Diagnosis not present

## 2019-11-23 DIAGNOSIS — E039 Hypothyroidism, unspecified: Secondary | ICD-10-CM | POA: Diagnosis not present

## 2019-11-23 DIAGNOSIS — I110 Hypertensive heart disease with heart failure: Secondary | ICD-10-CM | POA: Diagnosis not present

## 2019-11-25 DIAGNOSIS — F419 Anxiety disorder, unspecified: Secondary | ICD-10-CM | POA: Diagnosis not present

## 2019-11-25 DIAGNOSIS — I38 Endocarditis, valve unspecified: Secondary | ICD-10-CM | POA: Diagnosis not present

## 2019-11-25 DIAGNOSIS — E039 Hypothyroidism, unspecified: Secondary | ICD-10-CM | POA: Diagnosis not present

## 2019-11-25 DIAGNOSIS — I5020 Unspecified systolic (congestive) heart failure: Secondary | ICD-10-CM | POA: Diagnosis not present

## 2019-11-25 DIAGNOSIS — M810 Age-related osteoporosis without current pathological fracture: Secondary | ICD-10-CM | POA: Diagnosis not present

## 2019-11-25 DIAGNOSIS — I429 Cardiomyopathy, unspecified: Secondary | ICD-10-CM | POA: Diagnosis not present

## 2019-11-25 DIAGNOSIS — I110 Hypertensive heart disease with heart failure: Secondary | ICD-10-CM | POA: Diagnosis not present

## 2019-11-25 DIAGNOSIS — F329 Major depressive disorder, single episode, unspecified: Secondary | ICD-10-CM | POA: Diagnosis not present

## 2019-11-25 DIAGNOSIS — E7800 Pure hypercholesterolemia, unspecified: Secondary | ICD-10-CM | POA: Diagnosis not present

## 2019-12-02 DIAGNOSIS — M810 Age-related osteoporosis without current pathological fracture: Secondary | ICD-10-CM | POA: Diagnosis not present

## 2019-12-02 DIAGNOSIS — F329 Major depressive disorder, single episode, unspecified: Secondary | ICD-10-CM | POA: Diagnosis not present

## 2019-12-02 DIAGNOSIS — E7800 Pure hypercholesterolemia, unspecified: Secondary | ICD-10-CM | POA: Diagnosis not present

## 2019-12-02 DIAGNOSIS — I38 Endocarditis, valve unspecified: Secondary | ICD-10-CM | POA: Diagnosis not present

## 2019-12-02 DIAGNOSIS — I5020 Unspecified systolic (congestive) heart failure: Secondary | ICD-10-CM | POA: Diagnosis not present

## 2019-12-02 DIAGNOSIS — E039 Hypothyroidism, unspecified: Secondary | ICD-10-CM | POA: Diagnosis not present

## 2019-12-02 DIAGNOSIS — I110 Hypertensive heart disease with heart failure: Secondary | ICD-10-CM | POA: Diagnosis not present

## 2019-12-02 DIAGNOSIS — I429 Cardiomyopathy, unspecified: Secondary | ICD-10-CM | POA: Diagnosis not present

## 2019-12-02 DIAGNOSIS — F419 Anxiety disorder, unspecified: Secondary | ICD-10-CM | POA: Diagnosis not present

## 2019-12-04 DIAGNOSIS — F329 Major depressive disorder, single episode, unspecified: Secondary | ICD-10-CM | POA: Diagnosis not present

## 2019-12-04 DIAGNOSIS — I429 Cardiomyopathy, unspecified: Secondary | ICD-10-CM | POA: Diagnosis not present

## 2019-12-04 DIAGNOSIS — I110 Hypertensive heart disease with heart failure: Secondary | ICD-10-CM | POA: Diagnosis not present

## 2019-12-04 DIAGNOSIS — I5020 Unspecified systolic (congestive) heart failure: Secondary | ICD-10-CM | POA: Diagnosis not present

## 2019-12-04 DIAGNOSIS — E039 Hypothyroidism, unspecified: Secondary | ICD-10-CM | POA: Diagnosis not present

## 2019-12-04 DIAGNOSIS — M810 Age-related osteoporosis without current pathological fracture: Secondary | ICD-10-CM | POA: Diagnosis not present

## 2019-12-04 DIAGNOSIS — E7800 Pure hypercholesterolemia, unspecified: Secondary | ICD-10-CM | POA: Diagnosis not present

## 2019-12-04 DIAGNOSIS — I38 Endocarditis, valve unspecified: Secondary | ICD-10-CM | POA: Diagnosis not present

## 2019-12-04 DIAGNOSIS — F419 Anxiety disorder, unspecified: Secondary | ICD-10-CM | POA: Diagnosis not present

## 2019-12-07 DIAGNOSIS — E7800 Pure hypercholesterolemia, unspecified: Secondary | ICD-10-CM | POA: Diagnosis not present

## 2019-12-07 DIAGNOSIS — I429 Cardiomyopathy, unspecified: Secondary | ICD-10-CM | POA: Diagnosis not present

## 2019-12-07 DIAGNOSIS — E039 Hypothyroidism, unspecified: Secondary | ICD-10-CM | POA: Diagnosis not present

## 2019-12-07 DIAGNOSIS — I5020 Unspecified systolic (congestive) heart failure: Secondary | ICD-10-CM | POA: Diagnosis not present

## 2019-12-07 DIAGNOSIS — I110 Hypertensive heart disease with heart failure: Secondary | ICD-10-CM | POA: Diagnosis not present

## 2019-12-07 DIAGNOSIS — F419 Anxiety disorder, unspecified: Secondary | ICD-10-CM | POA: Diagnosis not present

## 2019-12-07 DIAGNOSIS — I38 Endocarditis, valve unspecified: Secondary | ICD-10-CM | POA: Diagnosis not present

## 2019-12-09 DIAGNOSIS — I5020 Unspecified systolic (congestive) heart failure: Secondary | ICD-10-CM | POA: Diagnosis not present

## 2019-12-09 DIAGNOSIS — F329 Major depressive disorder, single episode, unspecified: Secondary | ICD-10-CM | POA: Diagnosis not present

## 2019-12-09 DIAGNOSIS — I429 Cardiomyopathy, unspecified: Secondary | ICD-10-CM | POA: Diagnosis not present

## 2019-12-09 DIAGNOSIS — M810 Age-related osteoporosis without current pathological fracture: Secondary | ICD-10-CM | POA: Diagnosis not present

## 2019-12-09 DIAGNOSIS — F419 Anxiety disorder, unspecified: Secondary | ICD-10-CM | POA: Diagnosis not present

## 2019-12-09 DIAGNOSIS — I110 Hypertensive heart disease with heart failure: Secondary | ICD-10-CM | POA: Diagnosis not present

## 2019-12-09 DIAGNOSIS — E039 Hypothyroidism, unspecified: Secondary | ICD-10-CM | POA: Diagnosis not present

## 2019-12-09 DIAGNOSIS — E7800 Pure hypercholesterolemia, unspecified: Secondary | ICD-10-CM | POA: Diagnosis not present

## 2019-12-09 DIAGNOSIS — I38 Endocarditis, valve unspecified: Secondary | ICD-10-CM | POA: Diagnosis not present

## 2019-12-11 DIAGNOSIS — E7800 Pure hypercholesterolemia, unspecified: Secondary | ICD-10-CM | POA: Diagnosis not present

## 2019-12-11 DIAGNOSIS — M810 Age-related osteoporosis without current pathological fracture: Secondary | ICD-10-CM | POA: Diagnosis not present

## 2019-12-11 DIAGNOSIS — F419 Anxiety disorder, unspecified: Secondary | ICD-10-CM | POA: Diagnosis not present

## 2019-12-11 DIAGNOSIS — I5020 Unspecified systolic (congestive) heart failure: Secondary | ICD-10-CM | POA: Diagnosis not present

## 2019-12-11 DIAGNOSIS — I110 Hypertensive heart disease with heart failure: Secondary | ICD-10-CM | POA: Diagnosis not present

## 2019-12-11 DIAGNOSIS — I429 Cardiomyopathy, unspecified: Secondary | ICD-10-CM | POA: Diagnosis not present

## 2019-12-11 DIAGNOSIS — E039 Hypothyroidism, unspecified: Secondary | ICD-10-CM | POA: Diagnosis not present

## 2019-12-11 DIAGNOSIS — I38 Endocarditis, valve unspecified: Secondary | ICD-10-CM | POA: Diagnosis not present

## 2019-12-11 DIAGNOSIS — F329 Major depressive disorder, single episode, unspecified: Secondary | ICD-10-CM | POA: Diagnosis not present

## 2019-12-14 DIAGNOSIS — I38 Endocarditis, valve unspecified: Secondary | ICD-10-CM | POA: Diagnosis not present

## 2019-12-14 DIAGNOSIS — F419 Anxiety disorder, unspecified: Secondary | ICD-10-CM | POA: Diagnosis not present

## 2019-12-14 DIAGNOSIS — I429 Cardiomyopathy, unspecified: Secondary | ICD-10-CM | POA: Diagnosis not present

## 2019-12-14 DIAGNOSIS — E039 Hypothyroidism, unspecified: Secondary | ICD-10-CM | POA: Diagnosis not present

## 2019-12-14 DIAGNOSIS — I5020 Unspecified systolic (congestive) heart failure: Secondary | ICD-10-CM | POA: Diagnosis not present

## 2019-12-14 DIAGNOSIS — I110 Hypertensive heart disease with heart failure: Secondary | ICD-10-CM | POA: Diagnosis not present

## 2019-12-14 DIAGNOSIS — M810 Age-related osteoporosis without current pathological fracture: Secondary | ICD-10-CM | POA: Diagnosis not present

## 2019-12-14 DIAGNOSIS — E7800 Pure hypercholesterolemia, unspecified: Secondary | ICD-10-CM | POA: Diagnosis not present

## 2019-12-14 DIAGNOSIS — F329 Major depressive disorder, single episode, unspecified: Secondary | ICD-10-CM | POA: Diagnosis not present

## 2019-12-16 DIAGNOSIS — I5020 Unspecified systolic (congestive) heart failure: Secondary | ICD-10-CM | POA: Diagnosis not present

## 2019-12-16 DIAGNOSIS — M810 Age-related osteoporosis without current pathological fracture: Secondary | ICD-10-CM | POA: Diagnosis not present

## 2019-12-16 DIAGNOSIS — I429 Cardiomyopathy, unspecified: Secondary | ICD-10-CM | POA: Diagnosis not present

## 2019-12-16 DIAGNOSIS — F329 Major depressive disorder, single episode, unspecified: Secondary | ICD-10-CM | POA: Diagnosis not present

## 2019-12-16 DIAGNOSIS — E7800 Pure hypercholesterolemia, unspecified: Secondary | ICD-10-CM | POA: Diagnosis not present

## 2019-12-16 DIAGNOSIS — F419 Anxiety disorder, unspecified: Secondary | ICD-10-CM | POA: Diagnosis not present

## 2019-12-16 DIAGNOSIS — E039 Hypothyroidism, unspecified: Secondary | ICD-10-CM | POA: Diagnosis not present

## 2019-12-16 DIAGNOSIS — I38 Endocarditis, valve unspecified: Secondary | ICD-10-CM | POA: Diagnosis not present

## 2019-12-16 DIAGNOSIS — I110 Hypertensive heart disease with heart failure: Secondary | ICD-10-CM | POA: Diagnosis not present

## 2019-12-29 DIAGNOSIS — R0600 Dyspnea, unspecified: Secondary | ICD-10-CM | POA: Diagnosis not present

## 2019-12-29 DIAGNOSIS — I38 Endocarditis, valve unspecified: Secondary | ICD-10-CM | POA: Diagnosis not present

## 2019-12-29 DIAGNOSIS — I428 Other cardiomyopathies: Secondary | ICD-10-CM | POA: Diagnosis not present

## 2020-01-05 DIAGNOSIS — R0600 Dyspnea, unspecified: Secondary | ICD-10-CM | POA: Diagnosis not present

## 2020-01-05 DIAGNOSIS — I428 Other cardiomyopathies: Secondary | ICD-10-CM | POA: Diagnosis not present

## 2020-01-05 DIAGNOSIS — I38 Endocarditis, valve unspecified: Secondary | ICD-10-CM | POA: Diagnosis not present

## 2020-01-05 DIAGNOSIS — E7800 Pure hypercholesterolemia, unspecified: Secondary | ICD-10-CM | POA: Diagnosis not present

## 2020-01-06 ENCOUNTER — Other Ambulatory Visit: Payer: Self-pay

## 2020-01-06 ENCOUNTER — Observation Stay
Admission: EM | Admit: 2020-01-06 | Discharge: 2020-01-07 | Disposition: A | Discharge status: Home / Self Care | Payer: Medicare HMO | Attending: Internal Medicine | Admitting: Internal Medicine

## 2020-01-06 DIAGNOSIS — R531 Weakness: Secondary | ICD-10-CM

## 2020-01-06 DIAGNOSIS — E039 Hypothyroidism, unspecified: Secondary | ICD-10-CM | POA: Diagnosis present

## 2020-01-06 DIAGNOSIS — Z20822 Contact with and (suspected) exposure to covid-19: Secondary | ICD-10-CM | POA: Diagnosis not present

## 2020-01-06 DIAGNOSIS — D509 Iron deficiency anemia, unspecified: Principal | ICD-10-CM | POA: Insufficient documentation

## 2020-01-06 DIAGNOSIS — D649 Anemia, unspecified: Secondary | ICD-10-CM | POA: Diagnosis present

## 2020-01-06 DIAGNOSIS — I5022 Chronic systolic (congestive) heart failure: Secondary | ICD-10-CM | POA: Diagnosis not present

## 2020-01-06 DIAGNOSIS — F411 Generalized anxiety disorder: Secondary | ICD-10-CM | POA: Diagnosis present

## 2020-01-06 DIAGNOSIS — R0602 Shortness of breath: Secondary | ICD-10-CM | POA: Diagnosis not present

## 2020-01-06 DIAGNOSIS — I428 Other cardiomyopathies: Secondary | ICD-10-CM

## 2020-01-06 DIAGNOSIS — D500 Iron deficiency anemia secondary to blood loss (chronic): Secondary | ICD-10-CM | POA: Diagnosis present

## 2020-01-06 HISTORY — DX: Chronic systolic (congestive) heart failure: I50.22

## 2020-01-06 LAB — CBC
HCT: 24 % — ABNORMAL LOW (ref 36.0–46.0)
Hemoglobin: 6.8 g/dL — ABNORMAL LOW (ref 12.0–15.0)
MCH: 20.2 pg — ABNORMAL LOW (ref 26.0–34.0)
MCHC: 28.3 g/dL — ABNORMAL LOW (ref 30.0–36.0)
MCV: 71.2 fL — ABNORMAL LOW (ref 80.0–100.0)
Platelets: 371 10*3/uL (ref 150–400)
RBC: 3.37 MIL/uL — ABNORMAL LOW (ref 3.87–5.11)
RDW: 18.8 % — ABNORMAL HIGH (ref 11.5–15.5)
WBC: 5.7 10*3/uL (ref 4.0–10.5)
nRBC: 0 % (ref 0.0–0.2)

## 2020-01-06 LAB — COMPREHENSIVE METABOLIC PANEL
ALT: 14 U/L (ref 0–44)
AST: 23 U/L (ref 15–41)
Albumin: 3.3 g/dL — ABNORMAL LOW (ref 3.5–5.0)
Alkaline Phosphatase: 60 U/L (ref 38–126)
Anion gap: 12 (ref 5–15)
BUN: 11 mg/dL (ref 8–23)
CO2: 23 mmol/L (ref 22–32)
Calcium: 8.3 mg/dL — ABNORMAL LOW (ref 8.9–10.3)
Chloride: 104 mmol/L (ref 98–111)
Creatinine, Ser: 0.66 mg/dL (ref 0.44–1.00)
GFR, Estimated: >60 mL/min (ref >60)
Glucose, Bld: 98 mg/dL (ref 70–99)
Potassium: 3.5 mmol/L (ref 3.5–5.1)
Sodium: 139 mmol/L (ref 135–145)
Total Bilirubin: 0.7 mg/dL (ref 0.3–1.2)
Total Protein: 6.8 g/dL (ref 6.5–8.1)

## 2020-01-06 LAB — IRON AND TIBC
Iron: 22 ug/dL — ABNORMAL LOW (ref 28–170)
Saturation Ratios: 4 % — ABNORMAL LOW (ref 10.4–31.8)
TIBC: 498 ug/dL — ABNORMAL HIGH (ref 250–450)
UIBC: 476 ug/dL

## 2020-01-06 LAB — FOLATE: Folate: 39 ng/mL (ref >5.9)

## 2020-01-06 LAB — RESP PANEL BY RT-PCR (FLU A&B, COVID) ARPGX2
Influenza A by PCR: NEGATIVE
Influenza B by PCR: NEGATIVE
SARS Coronavirus 2 by RT PCR: NEGATIVE

## 2020-01-06 LAB — VITAMIN B12: Vitamin B-12: 344 pg/mL (ref 180–914)

## 2020-01-06 LAB — TROPONIN I (HIGH SENSITIVITY)
Troponin I (High Sensitivity): 7 ng/L (ref <18)
Troponin I (High Sensitivity): 8 ng/L (ref <18)

## 2020-01-06 LAB — ABO/RH: ABO/RH(D): A POS

## 2020-01-06 LAB — HEMOGLOBIN AND HEMATOCRIT, BLOOD
HCT: 27.2 % — ABNORMAL LOW (ref 36.0–46.0)
Hemoglobin: 8 g/dL — ABNORMAL LOW (ref 12.0–15.0)

## 2020-01-06 LAB — RETICULOCYTES
Immature Retic Fract: 22.3 % — ABNORMAL HIGH (ref 2.3–15.9)
RBC.: 3.46 MIL/uL — ABNORMAL LOW (ref 3.87–5.11)
Retic Count, Absolute: 100 10*3/uL (ref 19.0–186.0)
Retic Ct Pct: 2.9 % (ref 0.4–3.1)

## 2020-01-06 LAB — FERRITIN: Ferritin: 3 ng/mL — ABNORMAL LOW (ref 11–307)

## 2020-01-06 LAB — BRAIN NATRIURETIC PEPTIDE: B Natriuretic Peptide: 145 pg/mL — ABNORMAL HIGH (ref 0.0–100.0)

## 2020-01-06 LAB — PREPARE RBC (CROSSMATCH)

## 2020-01-06 MED ORDER — BUPROPION HCL ER (XL) 150 MG PO TB24
300.0000 mg | ORAL_TABLET | Freq: Every day | ORAL | Status: DC
  Filled 2020-01-06: qty 2

## 2020-01-06 MED ORDER — ACETAMINOPHEN 325 MG PO TABS: 650.0000 mg | ORAL_TABLET | Freq: Four times a day (QID) | ORAL | Status: DC | PRN

## 2020-01-06 MED ORDER — SODIUM CHLORIDE 0.9 % IV SOLN
10.0000 mL/h | Freq: Once | INTRAVENOUS | Status: AC
  Administered 2020-01-06: 10 mL/h via INTRAVENOUS

## 2020-01-06 MED ORDER — ACETAMINOPHEN 650 MG RE SUPP: 650.0000 mg | Freq: Four times a day (QID) | RECTAL | Status: DC | PRN

## 2020-01-06 MED ORDER — PANTOPRAZOLE SODIUM 40 MG IV SOLR
40.0000 mg | INTRAVENOUS | Status: DC
  Filled 2020-01-06: qty 40

## 2020-01-06 MED ORDER — LEVOTHYROXINE SODIUM 50 MCG PO TABS
100.0000 ug | ORAL_TABLET | Freq: Every day | ORAL | Status: DC
  Administered 2020-01-07: 100 ug via ORAL
  Filled 2020-01-06: qty 2

## 2020-01-06 MED ORDER — LORAZEPAM 2 MG/ML IJ SOLN: 0.5000 mg | Freq: Four times a day (QID) | INTRAMUSCULAR | Status: DC | PRN

## 2020-01-06 NOTE — ED Provider Notes (Signed)
[facility] Emergency Department Provider Note  ____________________________________________   Event Date/Time   First MD Initiated Contact with Patient 01/06/20 1353     (approximate)  I have reviewed the triage vital signs and the nursing notes.   HISTORY  Chief Complaint Abnormal Lab    HPI Cynthia Mcgrath is a 80 y.o. female here with reported shortness of breath and weakness.  The patient was sent from cardiology clinic due to abnormal lab work.  Per report, she has had progressively worsening generalized weakness and shortness of breath with exertion.  This is gotten worse over the last week.  She had some mild lightheadedness with standing.  She went to cardiology yesterday and had screening lab work and was sent home.  Lab work showed a significant anemia so she was sent in for further evaluation.  Denies any melena or hematochezia.  Denies known history of anemia.  No vaginal bleeding.  No recent trauma.  No abdominal pain.  She denies any history of abnormal colonoscopies.  No recent weight loss or night sweats.  No dietary changes.  No other complaints.        History reviewed. No pertinent past medical history.  There are no problems to display for this patient.   Past Surgical History:  Procedure Laterality Date  . BREAST EXCISIONAL BIOPSY Bilateral 1980's   NEG    Prior to Admission medications   Medication Sig Start Date End Date Taking? Authorizing Provider  cephALEXin (KEFLEX) 500 MG capsule Take 1 capsule (500 mg total) by mouth 4 (four) times daily. 06/08/17   Cuthriell, Charline Bills, PA-C  HYDROcodone-acetaminophen (NORCO/VICODIN) 5-325 MG tablet Take 1 tablet by mouth every 4 (four) hours as needed for moderate pain. 06/30/18   Cuthriell, Charline Bills, PA-C  phenazopyridine (PYRIDIUM) 95 MG tablet Take 1 tablet (95 mg total) by mouth 3 (three) times daily as needed for pain. 06/08/17   Cuthriell, Charline Bills, PA-C     Allergies Codeine  Family History  Problem Relation Age of Onset  . Breast cancer Cousin 42    Social History    Review of Systems  Review of Systems  Constitutional: Positive for fatigue. Negative for fever.  HENT: Negative for congestion and sore throat.   Eyes: Negative for visual disturbance.  Respiratory: Positive for shortness of breath. Negative for cough.   Cardiovascular: Negative for chest pain.  Gastrointestinal: Negative for abdominal pain, diarrhea, nausea and vomiting.  Genitourinary: Negative for flank pain.  Musculoskeletal: Negative for back pain and neck pain.  Skin: Negative for rash and wound.  Neurological: Positive for weakness and light-headedness.  All other systems reviewed and are negative.    ____________________________________________  PHYSICAL EXAM:      VITAL SIGNS: ED Triage Vitals  Enc Vitals Group     BP 01/06/20 0923 (!) 143/72     Pulse Rate 01/06/20 0923 94     Resp 01/06/20 0923 18     Temp 01/06/20 0923 99.1 F (37.3 C)     Temp Source 01/06/20 0923 Oral     SpO2 01/06/20 0923 96 %     Weight 01/06/20 0921 140 lb (63.5 kg)     Height 01/06/20 0921 5' (1.524 m)     Head Circumference --      Peak Flow --      Pain Score 01/06/20 0921 0     Pain Loc --      Pain Edu? --  Excl. in GC? --      Physical Exam Vitals and nursing note reviewed.  Constitutional:      General: She is not in acute distress.    Appearance: She is well-developed.  HENT:     Head: Normocephalic and atraumatic.  Eyes:     Conjunctiva/sclera: Conjunctivae normal.  Cardiovascular:     Rate and Rhythm: Normal rate and regular rhythm.     Heart sounds: Normal heart sounds. No murmur heard. No friction rub.  Pulmonary:     Effort: Pulmonary effort is normal. No respiratory distress.     Breath sounds: Normal breath sounds. No wheezing or rales.  Abdominal:     General: There is no distension.     Palpations: Abdomen is soft.      Tenderness: There is no abdominal tenderness.  Musculoskeletal:     Cervical back: Neck supple.     Right lower leg: No edema.     Left lower leg: No edema.  Skin:    General: Skin is warm.     Capillary Refill: Capillary refill takes less than 2 seconds.  Neurological:     Mental Status: She is alert and oriented to person, place, and time.     Motor: No abnormal muscle tone.       ____________________________________________   LABS (all labs ordered are listed, but only abnormal results are displayed)  Labs Reviewed  COMPREHENSIVE METABOLIC PANEL - Abnormal; Notable for the following components:      Result Value   Calcium 8.3 (*)    Albumin 3.3 (*)    All other components within normal limits  CBC - Abnormal; Notable for the following components:   RBC 3.37 (*)    Hemoglobin 6.8 (*)    HCT 24.0 (*)    MCV 71.2 (*)    MCH 20.2 (*)    MCHC 28.3 (*)    RDW 18.8 (*)    All other components within normal limits  IRON AND TIBC - Abnormal; Notable for the following components:   Iron 22 (*)    TIBC 498 (*)    Saturation Ratios 4 (*)    All other components within normal limits  FERRITIN - Abnormal; Notable for the following components:   Ferritin 3 (*)    All other components within normal limits  RETICULOCYTES - Abnormal; Notable for the following components:   RBC. 3.46 (*)    Immature Retic Fract 22.3 (*)    All other components within normal limits  BRAIN NATRIURETIC PEPTIDE - Abnormal; Notable for the following components:   B Natriuretic Peptide 145.0 (*)    All other components within normal limits  RESP PANEL BY RT-PCR (FLU A&B, COVID) ARPGX2  FOLATE  VITAMIN B12  OCCULT BLOOD X 1 CARD TO LAB, STOOL  POC OCCULT BLOOD, ED  TYPE AND SCREEN  PREPARE RBC (CROSSMATCH)  ABO/RH  TROPONIN I (HIGH SENSITIVITY)  TROPONIN I (HIGH SENSITIVITY)    ____________________________________________  EKG: Normal sinus rhythm, ventricular rate 88.  PR 162, QRS 90, QTc  459.  No acute ST elevation depression with no acute evidence of acute ischemia or infarct. ________________________________________  RADIOLOGY All imaging, including plain films, CT scans, and ultrasounds, independently reviewed by me, and interpretations confirmed via formal radiology reads.  ED MD interpretation:     Official radiology report(s): No results found.  ____________________________________________  PROCEDURES   Procedure(s) performed (including Critical Care):  Procedures  ____________________________________________  INITIAL IMPRESSION / MDM /  ASSESSMENT AND PLAN / ED COURSE  As part of my medical decision making, I reviewed the following data within the [place] notes reviewed and incorporated, Old chart reviewed, Notes from prior ED visits, and [place] Controlled Substance Database       *Cynthia Mcgrath was evaluated in Emergency Department on 01/06/2020 for the symptoms described in the history of present illness. She was evaluated in the context of the global COVID-19 pandemic, which necessitated consideration that the patient might be at risk for infection with the SARS-CoV-2 virus that causes COVID-19. Institutional protocols and algorithms that pertain to the evaluation of patients at risk for COVID-19 are in a state of rapid change based on information released by regulatory bodies including the CDC and federal and state organizations. These policies and algorithms were followed during the patient's care in the ED.  Some ED evaluations and interventions may be delayed as a result of limited staffing during the pandemic.*     Medical Decision Making: 80 year old female here with symptomatic anemia.  Hemoglobin is 6.8 on recheck here.  This is a microcytic anemia with anemia panel consistent with iron deficiency.  Unclear etiology, denies any melena or overt signs of GI bleed.  No vaginal bleeding.  Will obtain Hemoccult once patient is  roomed in private room.  Otherwise, I suspect that some of her weakness is also secondary to her cardiomyopathy but this does not appear acutely worsened and she has no chest pain.  Will plan for transfusion, admission.  ____________________________________________  FINAL CLINICAL IMPRESSION(S) / ED DIAGNOSES  Final diagnoses:  Symptomatic anemia  Iron deficiency anemia, unspecified iron deficiency anemia type     MEDICATIONS GIVEN DURING THIS VISIT:  Medications  0.9 %  sodium chloride infusion (has no administration in time range)     ED Discharge Orders    None       Note:  This document was prepared using Dragon voice recognition software and may include unintentional dictation errors.   Duffy Bruce, MD 01/06/20 [phone]

## 2020-01-06 NOTE — ED Triage Notes (Signed)
Pt had labs at [facility] yesterday and hgb was 6.6. endorses SOB.

## 2020-01-06 NOTE — H&P (Signed)
History and Physical    PLEASE NOTE THAT DRAGON DICTATION SOFTWARE WAS USED IN THE CONSTRUCTION OF THIS NOTE.   Cynthia Mcgrath [id number] DOB: 07-Apr-1940 DOA: 01/06/2020  PCP: Baxter Hire, MD Patient coming from: home   I have personally briefly reviewed patient's old medical records in [place]  Chief Complaint: Shortness of breath  HPI: Cynthia Mcgrath is a 80 y.o. female with medical history significant for chronic systolic heart failure with most recent LVEF 40%, acquired hypothyroidism, generalized anxiety disorder, who is admitted to [facility] on 01/06/2020 with symptomatic iron deficiency anemia after presenting from home to [facility] Emergency Department complaining of shortness of breath.   The following history was obtained via my discussions with the patient as well as my discussions with the patient's daughter, who was present at bedside, in addition to my discussions with the emergency department physician and via chart review.  The patient reports 3 to 4 months shortness of breath, worsening with exertion, but more recently also occurring.  She denies any associated chest pain, palpitations, diaphoresis, or presyncope.  Denies orthopnea, PND, or peripheral edema.  She also denies any recent cough, hemoptysis, calf tenderness, or lower extremity edema.  No recent subjective fever, chills, rigors, or generalized myalgias.  No recent trauma, travel, or known COVID-19 exposures.  Not associate with any recent nausea, vomiting, abdominal pain, diarrhea, melena, hematochezia.   She denies any known history of gastrointestinal bleeding, reports that she is on no blood thinning agents at home, including no aspirin.  She believes she has undergone a total of 1 colonoscopy over the course of her life, which she believes occurred 25 to 30 years ago for screening purposes, and does not believe that any significant abnormalities were identified at that time.  She  does not believe that she has ever undergone EGD.  Denies any known history of iron deficiency anemia, reports that she has never taken an iron supplement.   In the setting of shortness of breath, as described above, the patient presented to her outpatient cardiologist yesterday, at which time screening labs, including CBC were drawn.  Today, the patient was called by her cardiology office with results of the studies, which were notable for hemoglobin of 6.6 relative to most recent prior hemoglobin of 11.3 in March 2021.  Consequently, she was instructed by her cardiology clinic to present to the emergency department for further evaluation of suspected symptomatic anemia.  Further chart review reveals baseline hemoglobin range over the last few years of 11-13.   She denies any history of alcohol abuse, and also denies any known liver disease.      ED Course:  Vital signs in the ED were notable for the following: Temperature max 99.1, heart rate initially found to be 94, which decreased to 80-87 following initiation of PRBC transfusion, as further described below; respiratory rate 15-20, blood pressure 135/71; oxygen saturation 97 to 100% on room air.  CMP was notable for the following: Sodium 135, bicarbonate 23, creatinine 0.66.  BNP 145.  High-sensitivity troponin I x2 found to be nonelevated at 7 followed by value of 8.  Prior to initiation of a single unit transfusion of PRBC, iron studies were added to her presenting labs, with results currently pending.  Presenting CBC performed in the ED was notable for the following: White cell count 5700, hemoglobin 6.8 relative to most recent prior value of 11.3 on 03/25/2019, with this evening's hemoglobin associated with microcytic hypochromic findings  with an elevated RDW; platelets 371.  Nasopharyngeal COVID-19/influenza PCR performed in the ED this evening were found to be negative.  EKG by way of comparison to most recent prior on 11/18/2019 showed  normal sinus rhythm with heart rate 88, nonspecific T wave inversion in leads III and V1, which then V1 is unchanged relative to most recent prior EKG; additionally, no evidence of ST changes, including no evidence of ST elevation.  While still in the ED, initiation of slow transfusion of 1 unit PRBC over 4 h was initiated.  Subsequently, the patient was admitted to the med telemetry floor for further evaluation management of symptomatic anemia in the setting of likely subacute evidence of iron deficiency anemia.    Review of Systems: As per HPI otherwise 10 point review of systems negative.   History reviewed. No pertinent past medical history.  Past Surgical History:  Procedure Laterality Date  . BREAST EXCISIONAL BIOPSY Bilateral 1980's   NEG    Social History:  has no history on file for tobacco use, alcohol use, and drug use.   Allergies  Allergen Reactions  . Codeine Itching    Family History  Problem Relation Age of Onset  . Breast cancer Cousin 40     Prior to Admission medications   Medication Sig Start Date End Date Taking? Authorizing Provider  cephALEXin (KEFLEX) 500 MG capsule Take 1 capsule (500 mg total) by mouth 4 (four) times daily. 06/08/17   Cuthriell, Charline Bills, PA-C  HYDROcodone-acetaminophen (NORCO/VICODIN) 5-325 MG tablet Take 1 tablet by mouth every 4 (four) hours as needed for moderate pain. 06/30/18   Cuthriell, Charline Bills, PA-C  phenazopyridine (PYRIDIUM) 95 MG tablet Take 1 tablet (95 mg total) by mouth 3 (three) times daily as needed for pain. 06/08/17   Cuthriell, Charline Bills, PA-C     Objective    Physical Exam: Vitals:   01/06/20 0921 01/06/20 0923 01/06/20 1249  BP:  (!) 143/72 138/71  Pulse:  94 90  Resp:  18 20  Temp:  99.1 F (37.3 C) 98.6 F (37 C)  TempSrc:  Oral Oral  SpO2:  96% 94%  Weight: 63.5 kg    Height: 5' (1.524 m)      General: appears to be stated age; alert, oriented Skin: warm, dry, no rash Head:  AT/[place] Mouth:   Oral mucosa membranes appear moist, normal dentition Neck: supple; trachea midline Heart:  RRR; did not appreciate any M/R/G Lungs: CTAB, did not appreciate any wheezes, rales, or rhonchi Abdomen: + BS; soft, ND, NT Vascular: 2+ pedal pulses b/l; 2+ radial pulses b/l Extremities: no peripheral edema, no muscle wasting Neuro: strength and sensation intact in upper and lower extremities b/l    Labs on Admission: I have personally reviewed following labs and imaging studies  CBC: Recent Labs  Lab 01/06/20 0923  WBC 5.7  HGB 6.8*  HCT 24.0*  MCV 71.2*  PLT 123456   Basic Metabolic Panel: Recent Labs  Lab 01/06/20 0923  NA 139  K 3.5  CL 104  CO2 23  GLUCOSE 98  BUN 11  CREATININE 0.66  CALCIUM 8.3*   GFR: Estimated Creatinine Clearance: 46.7 mL/min (by C-G formula based on SCr of 0.66 mg/dL). Liver Function Tests: Recent Labs  Lab 01/06/20 0923  AST 23  ALT 14  ALKPHOS 60  BILITOT 0.7  PROT 6.8  ALBUMIN 3.3*   No results for input(s): LIPASE, AMYLASE in the last 168 hours. No results for input(s): AMMONIA in  the last 168 hours. Coagulation Profile: No results for input(s): INR, PROTIME in the last 168 hours. Cardiac Enzymes: No results for input(s): CKTOTAL, CKMB, CKMBINDEX, TROPONINI in the last 168 hours. BNP (last 3 results) No results for input(s): PROBNP in the last 8760 hours. HbA1C: No results for input(s): HGBA1C in the last 72 hours. CBG: No results for input(s): GLUCAP in the last 168 hours. Lipid Profile: No results for input(s): CHOL, HDL, LDLCALC, TRIG, CHOLHDL, LDLDIRECT in the last 72 hours. Thyroid Function Tests: No results for input(s): TSH, T4TOTAL, FREET4, T3FREE, THYROIDAB in the last 72 hours. Anemia Panel: Recent Labs    01/06/20 0923 01/06/20 1442  FOLATE 39.0  --   FERRITIN 3*  --   TIBC 498*  --   IRON 22*  --   RETICCTPCT  --  2.9   Urine analysis:    Component Value Date/Time   COLORURINE YELLOW (A) 06/08/2017 1611    APPEARANCEUR CLOUDY (A) 06/08/2017 1611   LABSPEC 1.012 06/08/2017 1611   PHURINE 5.0 06/08/2017 1611   GLUCOSEU NEGATIVE 06/08/2017 1611   HGBUR LARGE (A) 06/08/2017 1611   BILIRUBINUR NEGATIVE 06/08/2017 1611   KETONESUR NEGATIVE 06/08/2017 1611   PROTEINUR 100 (A) 06/08/2017 1611   NITRITE NEGATIVE 06/08/2017 1611   LEUKOCYTESUR LARGE (A) 06/08/2017 1611    Radiological Exams on Admission: No results found.   EKG: Independently reviewed, with result as described above.    Assessment/Plan   Cynthia Mcgrath is a 80 y.o. female with medical history significant for chronic systolic heart failure with most recent LVEF 40%, acquired hypothyroidism, generalized anxiety disorder, who is admitted to [facility] on 01/06/2020 with symptomatic iron deficiency anemia after presenting from home to [facility] Emergency Department complaining of shortness of breath.    Active Problems:   Symptomatic anemia   IDA (iron deficiency anemia)   Chronic systolic heart failure (HCC)   GAD (generalized anxiety disorder)   Generalized weakness   Acquired hypothyroidism    #) Symptomatic iron deficiency anemia: Diagnosis on the basis of new finding of anemia with presenting hemoglobin 6.8 relative to historical baseline range hemoglobin 11-13, with most recent prior value of 11.3 in March 2021.  Presenting hemoglobin is associated with microcytic and hypochromic findings as well as an elevated RDW, all of which appear consistent with iron deficiency anemia.  As the patient does not appear to have any clinical indicators or medical history to suggest diminished ability to absorb iron, I suspect iron deficiency anemia is on the basis of a slow gastrointestinal bleed, likely lower gastrointestinal anatomy given that her symptoms started 3 months ago, but has been able to maintain hemodynamically stable appearing vital signs throughout that time.  No colonoscopy over the last 25 years, as further  described.  In the setting of symptomatic iron deficiency anemia there appears to be an indication for additional evaluation with colonoscopy, although this does not appear to be needed in an urgent setting or timefram given her hemodynamic stability, as alluded to above, particularly given the suspected subacute timeframe of which she has developed this degree of anemia.  Not on any blood thinners as an outpatient, including no aspirin.  Denies any use of NSAIDs.  Additionally, she denies any history of alcohol abuse and no history of liver disease.  We'll add on iron studies and labs to attempt to confirm suspected iron deficiency etiology.  We'll also expand Cynthia Mcgrath evaluation to rule out any contributions from less likely possibilities,  including assessing folic acid deficiency, B12 deficiency.  No evidence of a hemolytic component at this time.  Also check reticulocyte count to evaluate for any hypoproliferation.  She is currently still receiving the 1 unit PRBC that was started in the ED approximately 2 h ago, navigating slow transfusion of such in the setting of a known history of chronic systolic heart failure.  Given suspected subacute timeframe for development of anemia as well as maintenance of hemodynamic stability over that time, suspect either a very slow upper gastrointestinal bleed versus a lower GI bleed, with differential currently favoring the above constellation of possibilities given nonelevation of BUN.  Differential/includes diverticular bleed, hemorrhoidal bleed, AVMs, bleeding polyps.  Plan: Finish transfusion 1 unit PRBC, followed by repeat H&H drawn approximately 30 min after completion of this transfusion.  Following which I have also ordered every 4 hour H&H's through tomorrow morning's a.m. labs.  Placed an order with type, screen, and hold 2 additional units PRBC.  Telemetry.  Add on INR.  Keep n.p.o. for now.  Avoiding pharmacologic DVT prophylaxis at the moment.  SCDs.  Add on  iron studies, as above, in addition to adding on the following: MMA, folic acid, reticulocyte count.  We'll also send peripheral smear for evaluation of the presence of schistocytes, although presentation appears less consistent with hemolysis.  Repeat CMP in the morning.  Hemoccult blood result currently pending.  Will consult gastroenterology for recommendations regarding the necessity/timing of colonoscopy evaluation in this 80 year old female with new onset colonoscopy over the last 25 years.      #) Generalized weakness: The patient reports generalized weakness over the last 2 months in the absence of any acute focal weakness.  Suspect contribution from presenting subacute iron deficiency anemia, as above.  No evidence of underlying infectious process at this time, including negative nasopharyngeal COVID-19 PCR performed in the ED today.  Will check urinalysis to further evaluate.  Plan: Work-up and management of anemia, as above.  We'll also check TSH, particular given documented history of acquired hypothyroidism.  Check urinalysis.      #) Chronic systolic heart failure: Follows with [facility] cardiology, with review of documentation of such conveying that most recent echocardiogram results were notable for LVEF of 40%.  Presentation does not appear to be consistent with acutely onset heart failure, although the patient would certainly be at risk for development of such in the setting of anemia induced heart failure.  She reports that her outpatient diuretic regimen is limited to as needed Lasix, which she has not taken in several weeks now.  She is also on Coreg and was just prescribed losartan, but has not yet started this medication.  Plan: Monitor strict I's and O's and daily weights.  Close monitoring for development of acute volume overload in the setting of PRB transfusion, as above.  Holding beta-blocker this evening in the setting of suspected blood loss anemia.  Monitor on  symmetry.     #) Acquired hypothyroidism: On Synthroid as an outpatient.  Plan: Check TSH, particularly in the setting of patient's report of 2 months of generalized weakness.     #) Generalized anxiety disorder: On Wellbutrin as well as scheduled Ativan 1 mg p.o. 3 times daily.  While the patient appears alert and oriented, may warrant consideration for de-escalating from scheduled 3 mg of Ativan on a daily basis in this 80 year old female.   Plan: Continue home Wellbutrin.  Have changed her scheduled Ativan to as needed during this hospitalization, with  potential additional consideration on outpatient basis for further de-escalation of her benzodiazepine regimen.     DVT prophylaxis: SCDs Code Status: Full code Family Communication: The patient's case was discussed with her daughter, who was present at bedside, after receiving permission from the patient to do so. Disposition Plan: Per Rounding Team Admission status: Observation; med telemetry.    Of note, this patient was added by me to the following Admit List/Treatment Team:  armcadmits      PLEASE NOTE THAT DRAGON DICTATION SOFTWARE WAS USED IN THE CONSTRUCTION OF THIS NOTE.   [facility] Pager [phone] From 12PM- 12AM  Otherwise, please contact night-coverage  [email or web address] Password [email or web address]  01/06/2020, 4:20 PM

## 2020-01-06 NOTE — Progress Notes (Signed)
Brief note regarding plan, with full H&P to follow:   Cynthia Mcgrath is a 80 y.o. female with medical history significant for chronic systolic heart failure with most recent LVEF 40%, acquired hypothyroidism, generalized anxiety disorder, who is admitted to [facility] on 01/06/2020 with symptomatic iron deficiency anemia after presenting from home to [facility] Emergency Department complaining of shortness of breath.  Presenting hemoglobin found to be 6.8 relative to her baseline range of 11-13 with RDW and iron studies suggestive of iron deficiency anemia, although in the absence of specific source.  She has received transfusion of 1 unit PRBC administered slightly in the setting of systolic dysfunction heart failure, and I have ordered every 4 hour serial H&H's overnight, with plan to to transfuse an additional unit should subsequent hemoglobin level to be less than 7 or if the patient is symptomatic. Will consult GI as well.      Newton Pigg, DO Hospitalist

## 2020-01-06 NOTE — ED Notes (Signed)
First RN note:  Pt sent over by [facility] cardiology.  Pt has abnormal lab of a Hgb of 6.6.

## 2020-01-07 ENCOUNTER — Other Ambulatory Visit: Payer: Self-pay

## 2020-01-07 ENCOUNTER — Encounter: Payer: Self-pay | Admitting: Internal Medicine

## 2020-01-07 DIAGNOSIS — D509 Iron deficiency anemia, unspecified: Secondary | ICD-10-CM | POA: Diagnosis present

## 2020-01-07 DIAGNOSIS — R531 Weakness: Secondary | ICD-10-CM

## 2020-01-07 DIAGNOSIS — I5022 Chronic systolic (congestive) heart failure: Secondary | ICD-10-CM | POA: Diagnosis present

## 2020-01-07 DIAGNOSIS — D500 Iron deficiency anemia secondary to blood loss (chronic): Secondary | ICD-10-CM | POA: Diagnosis present

## 2020-01-07 DIAGNOSIS — E039 Hypothyroidism, unspecified: Secondary | ICD-10-CM | POA: Diagnosis present

## 2020-01-07 DIAGNOSIS — D649 Anemia, unspecified: Secondary | ICD-10-CM | POA: Diagnosis not present

## 2020-01-07 DIAGNOSIS — F411 Generalized anxiety disorder: Secondary | ICD-10-CM | POA: Diagnosis present

## 2020-01-07 LAB — MAGNESIUM
Magnesium: 2.3 mg/dL (ref 1.7–2.4)
Magnesium: 2.5 mg/dL — ABNORMAL HIGH (ref 1.7–2.4)

## 2020-01-07 LAB — TYPE AND SCREEN
ABO/RH(D): A POS
Antibody Screen: NEGATIVE
Unit division: 0
Unit division: 0

## 2020-01-07 LAB — BPAM RBC
Blood Product Expiration Date: 202201292359
Blood Product Expiration Date: 202201292359
ISSUE DATE / TIME: 202201051644
Unit Type and Rh: 6200
Unit Type and Rh: 6200

## 2020-01-07 LAB — RETICULOCYTES
Immature Retic Fract: 32.6 % — ABNORMAL HIGH (ref 2.3–15.9)
RBC.: 4.06 MIL/uL (ref 3.87–5.11)
Retic Count, Absolute: 78.8 10*3/uL (ref 19.0–186.0)
Retic Ct Pct: 1.9 % (ref 0.4–3.1)

## 2020-01-07 LAB — PROTIME-INR
INR: 1.1 (ref 0.8–1.2)
Prothrombin Time: 13.3 seconds (ref 11.4–15.2)

## 2020-01-07 LAB — FOLATE: Folate: 41 ng/mL (ref >5.9)

## 2020-01-07 LAB — COMPREHENSIVE METABOLIC PANEL
ALT: 12 U/L (ref 0–44)
AST: 28 U/L (ref 15–41)
Albumin: 3.1 g/dL — ABNORMAL LOW (ref 3.5–5.0)
Alkaline Phosphatase: 55 U/L (ref 38–126)
Anion gap: 7 (ref 5–15)
BUN: 9 mg/dL (ref 8–23)
CO2: 26 mmol/L (ref 22–32)
Calcium: 8.2 mg/dL — ABNORMAL LOW (ref 8.9–10.3)
Chloride: 108 mmol/L (ref 98–111)
Creatinine, Ser: 0.6 mg/dL (ref 0.44–1.00)
GFR, Estimated: >60 mL/min (ref >60)
Glucose, Bld: 90 mg/dL (ref 70–99)
Potassium: 3.6 mmol/L (ref 3.5–5.1)
Sodium: 141 mmol/L (ref 135–145)
Total Bilirubin: 0.9 mg/dL (ref 0.3–1.2)
Total Protein: 6.6 g/dL (ref 6.5–8.1)

## 2020-01-07 LAB — PATHOLOGIST SMEAR REVIEW

## 2020-01-07 LAB — CBC
HCT: 29 % — ABNORMAL LOW (ref 36.0–46.0)
Hemoglobin: 8.6 g/dL — ABNORMAL LOW (ref 12.0–15.0)
MCH: 21.4 pg — ABNORMAL LOW (ref 26.0–34.0)
MCHC: 29.7 g/dL — ABNORMAL LOW (ref 30.0–36.0)
MCV: 72.1 fL — ABNORMAL LOW (ref 80.0–100.0)
Platelets: 344 10*3/uL (ref 150–400)
RBC: 4.02 MIL/uL (ref 3.87–5.11)
RDW: 18.4 % — ABNORMAL HIGH (ref 11.5–15.5)
WBC: 5.6 10*3/uL (ref 4.0–10.5)
nRBC: 0 % (ref 0.0–0.2)

## 2020-01-07 LAB — TSH: TSH: 0.189 u[IU]/mL — ABNORMAL LOW (ref 0.350–4.500)

## 2020-01-07 LAB — PREPARE RBC (CROSSMATCH)

## 2020-01-07 MED ORDER — FERROUS SULFATE 325 (65 FE) MG PO TABS: 325.0000 mg | ORAL_TABLET | ORAL | 0 refills | Status: DC

## 2020-01-07 NOTE — H&P (View-Only) (Signed)
  Lamiya Naas, MD FACG  [address] [place],  [address] Phone: [phone] Fax : [phone]  Consultation  Referring Provider:     Dr. Howerter Primary Care Physician:  Johnston, John D, MD Primary Gastroenterologist: Unassigned         Reason for Consultation:     Anemia  Date of Admission:  01/06/2020 Date of Consultation:  01/07/2020         HPI:   Cynthia Mcgrath is a 79 y.o. female who has a history of systolic heart failure hypothyroidism and reports that she has not had a colonoscopy in at least 20 years.  The patient states that she had been feeling weak with shortness of breath for some time.  The patient was found to have iron deficiency anemia with a iron saturation of 4 and an iron level of 22 with a hemoglobin of 6.8.  2 years ago her hemoglobin was 12.9.  In March of last year her hemoglobin was 11.3.  The patient denies seeing any bright red blood per rectum black stools or bloody stools.  She also denies any NSAID abuse or alcohol abuse.  She has been in her usual state of health.  The shortness of breath was reported to be over the last 3 to 4 months worsening with exertion.  The patient was transfused with 1 unit of blood and went from 6.8 to 8.0 yesterday and increased to 8.6 today.  The patient also had a low MCV.  Past Medical History:  Diagnosis Date  . Chronic systolic heart failure (HCC)     Past Surgical History:  Procedure Laterality Date  . BREAST EXCISIONAL BIOPSY Bilateral 1980's   NEG    Prior to Admission medications   Medication Sig Start Date End Date Taking? Authorizing Provider  buPROPion (WELLBUTRIN XL) 300 MG 24 hr tablet Take 300 mg by mouth daily. 09/12/19  Yes [provider]  carvedilol (COREG) 6.25 MG tablet Take 6.25 mg by mouth 2 (two) times daily with a meal.   Yes [provider]  furosemide (LASIX) 20 MG tablet Take 20 mg by mouth daily as needed for edema or fluid.   Yes [provider]   levothyroxine (SYNTHROID) 100 MCG tablet Take 100 mcg by mouth daily before breakfast.   Yes [provider]  LORazepam (ATIVAN) 1 MG tablet Take 1 mg by mouth 3 (three) times daily as needed for anxiety. 09/12/19  Yes [provider]  losartan (COZAAR) 25 MG tablet Take 25 mg by mouth daily.   Yes [provider]  omeprazole (PRILOSEC) 20 MG capsule Take 20 mg by mouth daily.   Yes [provider]  sertraline (ZOLOFT) 100 MG tablet Take 150 mg by mouth daily.   Yes [provider]    Family History  Problem Relation Age of Onset  . Breast cancer Cousin 40     Social History   Tobacco Use  . Smoking status: Never Smoker  . Smokeless tobacco: Never Used  Substance Use Topics  . Alcohol use: Not Currently  . Drug use: Never    Allergies as of 01/06/2020 - Review Complete 01/06/2020  Allergen Reaction Noted  . Codeine Itching 06/08/2017    Review of Systems:    All systems reviewed and negative except where noted in HPI.   Physical Exam:  Vital signs in last 24 hours: Temp:  [98.5 F (36.9 C)-98.8 F (37.1 C)] 98.8 F (37.1 C) (01/05 1958) Pulse   Rate:  [70-102] 102 (01/06 0951) Resp:  [14-22] 15 (01/06 0618) BP: (135-164)/(57-94) 164/84 (01/06 0618) SpO2:  [93 %-100 %] 100 % (01/06 0951)   General:   Pleasant, cooperative in NAD Head:  Normocephalic and atraumatic. Eyes:   No icterus.   Conjunctiva pink. PERRLA. Ears:  Normal auditory acuity. Neck:  Supple; no masses or thyroidomegaly Lungs: Respirations even and unlabored. Lungs clear to auscultation bilaterally.   No wheezes, crackles, or rhonchi.  Heart:  Regular rate and rhythm;  Without murmur, clicks, rubs or gallops Abdomen:  Soft, nondistended, nontender. Normal bowel sounds. No appreciable masses or hepatomegaly.  No rebound or guarding.  Rectal:  Not performed. Msk:  Symmetrical without gross deformities.    Extremities:  Without edema, cyanosis or  clubbing. Neurologic:  Alert and oriented x3;  grossly normal neurologically. Skin:  Intact without significant lesions or rashes. Cervical Nodes:  No significant cervical adenopathy. Psych:  Alert and cooperative. Normal affect.  LAB RESULTS: Recent Labs    01/06/20 0923 01/06/20 2039 01/07/20 0404  WBC 5.7  --  5.6  HGB 6.8* 8.0* 8.6*  HCT 24.0* 27.2* 29.0*  PLT 371  --  344   BMET Recent Labs    01/06/20 0923 01/07/20 0404  NA 139 141  K 3.5 3.6  CL 104 108  CO2 23 26  GLUCOSE 98 90  BUN 11 9  CREATININE 0.66 0.60  CALCIUM 8.3* 8.2*   LFT Recent Labs    01/07/20 0404  PROT 6.6  ALBUMIN 3.1*  AST 28  ALT 12  ALKPHOS 55  BILITOT 0.9   PT/INR Recent Labs    01/07/20 0404  LABPROT 13.3  INR 1.1    STUDIES: No results found.    Impression / Plan:   Assessment: Active Problems:   Symptomatic anemia   IDA (iron deficiency anemia)   Chronic systolic heart failure (HCC)   GAD (generalized anxiety disorder)   Generalized weakness   Acquired hypothyroidism   Cynthia Mcgrath is a 80 y.o. y/o female with with iron deficiency anemia that the patient reported to be symptomatic.  The patient has responded well to 1 unit of blood transfusion.  There is no sign of any overt GI bleeding such as black stools or bloody stools.  The patient also denies any change in bowel habits nausea vomiting fevers or chills.  The patient has not had a screening colonoscopy and reports that her last colonoscopy was over 20 years ago.  Plan:  This patient responded well to a blood transfusion with her hemoglobin being 8.6 up from 6.8.  The patient has no complaints at the present time.  The patient should undergo a work-up with an EGD and colonoscopy.  The patient has been set up for a EGD and colonoscopy for next week.  The patient will come to the office after she is discharged from the ER for instructions and the prep.  The patient and her daughter have been explained the plan  and agree with it.  Thank you for involving me in the care of this patient.      LOS: 0 days   Midge Minium, MD, [facility] 01/07/2020, 11:00 AM,  Pager [phone] 7am-5pm  Check AMION for 5pm -7am coverage and on weekends   Note: This dictation was prepared with Dragon dictation along with smaller phrase technology. Any transcriptional errors that result from this process are unintentional.

## 2020-01-07 NOTE — Progress Notes (Signed)
  Update: I have consulted the on-call gastroenterologist, Dr.Toledo, for assistance regarding recommendations for the necessity and timing of endoscopic evaluation of this patient.    Newton Pigg, DO Hospitalist

## 2020-01-07 NOTE — Discharge Summary (Signed)
Physician Discharge Summary  Cynthia Mcgrath [id number] DOB: 1940-11-25 DOA: 01/06/2020  PCP: Baxter Hire, MD  Admit date: 01/06/2020 Discharge date: 01/07/2020  Discharge disposition: Home   Recommendations for Outpatient Follow-Up:   Follow-up at Dr. Dorothey Baseman office after discharge from the hospital today for directions regarding EGD and colonoscopy preparations.  Follow-up with Dr. Allen Norris, gastroenterologist, on Tuesday, 01/12/2020 for EGD and colonoscopy.   Discharge Diagnosis:   Active Problems:   Symptomatic anemia   IDA (iron deficiency anemia)   Chronic systolic heart failure (HCC)   GAD (generalized anxiety disorder)   Generalized weakness   Acquired hypothyroidism    Discharge Condition: Stable.     Code Status: Full Code     Hospital Course:   Cynthia Mcgrath is an 80 year old woman with medical history significant for chronic systolic CHF (EF in November 2021 was 40%), hypothyroidism, generalized anxiety disorder, who was referred to the hospital by cannot do a cardiology office for evaluation of low hemoglobin of 6.6.  She complained of progressive shortness of breath of about 3 to 4 months duration associated with generalized weakness and easy fatigability.  She has no history of GI bleeding.  She was found to have hemoglobin of 6.8 in the hospital.  She was transfused with 1 unit of packed red blood cells with posttransfusion hemoglobin has remained stable.  Iron studies was consistent with iron deficiency anemia.  Gastroenterologist, Dr. Allen Norris, was consulted because of iron deficiency anemia.  He recommended outpatient follow-up for EGD and colonoscopy on 01/12/2020.  He said patient could be discharged from his standpoint.  The patient is feeling better and she is deemed stable for discharge to home today.  Discharge plan was discussed with the patient and her daughter, Manuela Schwartz, at the bedside.   Suggest   Discharge Exam:    Vitals:   01/07/20 0949  01/07/20 0950 01/07/20 0951 01/07/20 1100  BP:    135/73  Pulse: 92 95 (!) 102 95  Resp:    16  Temp:      TempSrc:      SpO2: 98% 100% 100% 100%  Weight:      Height:         GEN: NAD SKIN: Warm and dry EYES: EOMI ENT: MMM CV: RRR PULM: CTA B ABD: soft, ND, NT, +BS CNS: AAO x 3, non focal EXT: No edema or tenderness   The results of significant diagnostics from this hospitalization (including imaging, microbiology, ancillary and laboratory) are listed below for reference.     Procedures and Diagnostic Studies:   No results found.   Labs:   Basic Metabolic Panel: Recent Labs  Lab 01/06/20 0923 01/07/20 0404  NA 139 141  K 3.5 3.6  CL 104 108  CO2 23 26  GLUCOSE 98 90  BUN 11 9  CREATININE 0.66 0.60  CALCIUM 8.3* 8.2*  MG  --  2.5*  2.3   GFR Estimated Creatinine Clearance: 46.7 mL/min (by C-G formula based on SCr of 0.6 mg/dL). Liver Function Tests: Recent Labs  Lab 01/06/20 0923 01/07/20 0404  AST 23 28  ALT 14 12  ALKPHOS 60 55  BILITOT 0.7 0.9  PROT 6.8 6.6  ALBUMIN 3.3* 3.1*   No results for input(s): LIPASE, AMYLASE in the last 168 hours. No results for input(s): AMMONIA in the last 168 hours. Coagulation profile Recent Labs  Lab 01/07/20 0404  INR 1.1    CBC: Recent Labs  Lab 01/06/20 0923 01/06/20 2039  01/07/20 0404  WBC 5.7  --  5.6  HGB 6.8* 8.0* 8.6*  HCT 24.0* 27.2* 29.0*  MCV 71.2*  --  72.1*  PLT 371  --  344   Cardiac Enzymes: No results for input(s): CKTOTAL, CKMB, CKMBINDEX, TROPONINI in the last 168 hours. BNP: Invalid input(s): POCBNP CBG: No results for input(s): GLUCAP in the last 168 hours. D-Dimer No results for input(s): DDIMER in the last 72 hours. Hgb A1c No results for input(s): HGBA1C in the last 72 hours. Lipid Profile No results for input(s): CHOL, HDL, LDLCALC, TRIG, CHOLHDL, LDLDIRECT in the last 72 hours. Thyroid function studies Recent Labs    01/07/20 0404  TSH 0.189*   Anemia work  up Recent Labs    01/06/20 0923 01/06/20 1442 01/07/20 0404  VITAMINB12 344  --   --   FOLATE 39.0  --  41.0  FERRITIN 3*  --   --   TIBC 498*  --   --   IRON 22*  --   --   RETICCTPCT  --  2.9 1.9   Microbiology Recent Results (from the past 240 hour(s))  Resp Panel by RT-PCR (Flu A&B, Covid) Nasopharyngeal Swab     Status: None   Collection Time: 01/06/20  2:36 PM   Specimen: Nasopharyngeal Swab; Nasopharyngeal(NP) swabs in vial transport medium  Result Value Ref Range Status   SARS Coronavirus 2 by RT PCR NEGATIVE NEGATIVE Final    Comment: (NOTE) SARS-CoV-2 target nucleic acids are NOT DETECTED.  The SARS-CoV-2 RNA is generally detectable in upper respiratory specimens during the acute phase of infection. The lowest concentration of SARS-CoV-2 viral copies this assay can detect is 138 copies/mL. A negative result does not preclude SARS-Cov-2 infection and should not be used as the sole basis for treatment or other patient management decisions. A negative result may occur with  improper specimen collection/handling, submission of specimen other than nasopharyngeal swab, presence of viral mutation(s) within the areas targeted by this assay, and inadequate number of viral copies(<138 copies/mL). A negative result must be combined with clinical observations, patient history, and epidemiological information. The expected result is Negative.  Fact Sheet for Patients:  BloggerCourse.com  Fact Sheet for Healthcare Providers:  SeriousBroker.it  This test is no t yet approved or cleared by the [place] FDA and  has been authorized for detection and/or diagnosis of SARS-CoV-2 by FDA under an Emergency Use Authorization (EUA). This EUA will remain  in effect (meaning this test can be used) for the duration of the COVID-19 declaration under Section 564(b)(1) of the Act, 21 U.S.C.section 360bbb-3(b)(1), unless the  authorization is terminated  or revoked sooner.       Influenza A by PCR NEGATIVE NEGATIVE Final   Influenza B by PCR NEGATIVE NEGATIVE Final    Comment: (NOTE) The Xpert Xpress SARS-CoV-2/FLU/RSV plus assay is intended as an aid in the diagnosis of influenza from Nasopharyngeal swab specimens and should not be used as a sole basis for treatment. Nasal washings and aspirates are unacceptable for Xpert Xpress SARS-CoV-2/FLU/RSV testing.  Fact Sheet for Patients: BloggerCourse.com  Fact Sheet for Healthcare Providers: SeriousBroker.it  This test is not yet approved or cleared by the [place] FDA and has been authorized for detection and/or diagnosis of SARS-CoV-2 by FDA under an Emergency Use Authorization (EUA). This EUA will remain in effect (meaning this test can be used) for the duration of the COVID-19 declaration under Section 564(b)(1) of the Act, 21 U.S.C. section 360bbb-3(b)(1), unless  the authorization is terminated or revoked.  Performed at [facility], [address] [place], [place] [address]      Discharge Instructions:   Discharge Instructions    Call MD for:  difficulty breathing, headache or visual disturbances   Complete by: As directed    Call MD for:  extreme fatigue   Complete by: As directed    Call MD for:  persistant dizziness or light-headedness   Complete by: As directed    Diet - low sodium heart healthy   Complete by: As directed    Discharge instructions   Complete by: As directed    Go to Dr. Dorothey Baseman office after discharge from the hospital today for instructions regarding EGD and colon preparation. Follow-up with Dr. Allen Norris for EGD and colonoscopy on Tuesday, 01/12/2020   Increase activity slowly   Complete by: As directed      Allergies as of 01/07/2020      Reactions   Codeine Itching      Medication List    TAKE these medications   buPROPion 300 MG 24 hr  tablet Commonly known as: WELLBUTRIN XL Take 300 mg by mouth daily.   carvedilol 6.25 MG tablet Commonly known as: COREG Take 6.25 mg by mouth 2 (two) times daily with a meal.   ferrous sulfate 325 (65 FE) MG tablet Take 1 tablet (325 mg total) by mouth every other day.   furosemide 20 MG tablet Commonly known as: LASIX Take 20 mg by mouth daily as needed for edema or fluid.   levothyroxine 100 MCG tablet Commonly known as: SYNTHROID Take 100 mcg by mouth daily before breakfast.   LORazepam 1 MG tablet Commonly known as: ATIVAN Take 1 mg by mouth 3 (three) times daily as needed for anxiety.   losartan 25 MG tablet Commonly known as: COZAAR Take 25 mg by mouth daily.   omeprazole 20 MG capsule Commonly known as: PRILOSEC Take 20 mg by mouth daily.   sertraline 100 MG tablet Commonly known as: ZOLOFT Take 150 mg by mouth daily.       Follow-up Information    Lucilla Lame, MD Follow up on 01/12/2020.   Specialty: Gastroenterology Why: For EGD and colonoscopy Contact information: [place] [id number]                Time coordinating discharge: 34 minutes  Signed:  Jennye Boroughs  [facility] 01/07/2020, 11:18 AM   Pager on [email or web address]. If 7PM-7AM, please contact night-coverage at [email or web address]

## 2020-01-07 NOTE — Consult Note (Signed)
Lucilla Lame, MD [facility]  [address] [place], [place] [address] Phone: [phone] Fax : [phone]  Consultation  Referring Provider:     Dr. Velia Meyer Primary Care Physician:  Baxter Hire, MD Primary Gastroenterologist: Althia Forts         Reason for Consultation:     Anemia  Date of Admission:  01/06/2020 Date of Consultation:  01/07/2020         HPI:   Cynthia Mcgrath is a 80 y.o. female who has a history of systolic heart failure hypothyroidism and reports that she has not had a colonoscopy in at least 30 years.  The patient states that she had been feeling weak with shortness of breath for some time.  The patient was found to have iron deficiency anemia with a iron saturation of 4 and an iron level of 22 with a hemoglobin of 6.8.  2 years ago her hemoglobin was 12.9.  In March of last year her hemoglobin was 11.3.  The patient denies seeing any bright red blood per rectum black stools or bloody stools.  She also denies any NSAID abuse or alcohol abuse.  She has been in her usual state of health.  The shortness of breath was reported to be over the last 3 to 4 months worsening with exertion.  The patient was transfused with 1 unit of blood and went from 6.8 to 8.0 yesterday and increased to 8.6 today.  The patient also had a low MCV.  Past Medical History:  Diagnosis Date  . Chronic systolic heart failure [facility])     Past Surgical History:  Procedure Laterality Date  . BREAST EXCISIONAL BIOPSY Bilateral 1980's   NEG    Prior to Admission medications   Medication Sig Start Date End Date Taking? Authorizing Provider  buPROPion (WELLBUTRIN XL) 300 MG 24 hr tablet Take 300 mg by mouth daily. 09/12/19  Yes [provider]  carvedilol (COREG) 6.25 MG tablet Take 6.25 mg by mouth 2 (two) times daily with a meal.   Yes [provider]  furosemide (LASIX) 20 MG tablet Take 20 mg by mouth daily as needed for edema or fluid.   Yes [provider]   levothyroxine (SYNTHROID) 100 MCG tablet Take 100 mcg by mouth daily before breakfast.   Yes [provider]  LORazepam (ATIVAN) 1 MG tablet Take 1 mg by mouth 3 (three) times daily as needed for anxiety. 09/12/19  Yes [provider]  losartan (COZAAR) 25 MG tablet Take 25 mg by mouth daily.   Yes [provider]  omeprazole (PRILOSEC) 20 MG capsule Take 20 mg by mouth daily.   Yes [provider]  sertraline (ZOLOFT) 100 MG tablet Take 150 mg by mouth daily.   Yes [provider]    Family History  Problem Relation Age of Onset  . Breast cancer Cousin 61     Social History   Tobacco Use  . Smoking status: Never Smoker  . Smokeless tobacco: Never Used  Substance Use Topics  . Alcohol use: Not Currently  . Drug use: Never    Allergies as of 01/06/2020 - Review Complete 01/06/2020  Allergen Reaction Noted  . Codeine Itching 06/08/2017    Review of Systems:    All systems reviewed and negative except where noted in HPI.   Physical Exam:  Vital signs in last 24 hours: Temp:  [98.5 F (36.9 C)-98.8 F (37.1 C)] 98.8 F (37.1 C) (01/05 1958) Pulse  Rate:  [70-102] 102 (01/06 0951) Resp:  [14-22] 15 (01/06 0618) BP: (135-164)/(57-94) 164/84 (01/06 0618) SpO2:  [93 %-100 %] 100 % (01/06 0951)   General:   Pleasant, cooperative in NAD Head:  Normocephalic and atraumatic. Eyes:   No icterus.   Conjunctiva pink. PERRLA. Ears:  Normal auditory acuity. Neck:  Supple; no masses or thyroidomegaly Lungs: Respirations even and unlabored. Lungs clear to auscultation bilaterally.   No wheezes, crackles, or rhonchi.  Heart:  Regular rate and rhythm;  Without murmur, clicks, rubs or gallops Abdomen:  Soft, nondistended, nontender. Normal bowel sounds. No appreciable masses or hepatomegaly.  No rebound or guarding.  Rectal:  Not performed. Msk:  Symmetrical without gross deformities.    Extremities:  Without edema, cyanosis or  clubbing. Neurologic:  Alert and oriented x3;  grossly normal neurologically. Skin:  Intact without significant lesions or rashes. Cervical Nodes:  No significant cervical adenopathy. Psych:  Alert and cooperative. Normal affect.  LAB RESULTS: Recent Labs    01/06/20 0923 01/06/20 2039 01/07/20 0404  WBC 5.7  --  5.6  HGB 6.8* 8.0* 8.6*  HCT 24.0* 27.2* 29.0*  PLT 371  --  344   BMET Recent Labs    01/06/20 0923 01/07/20 0404  NA 139 141  K 3.5 3.6  CL 104 108  CO2 23 26  GLUCOSE 98 90  BUN 11 9  CREATININE 0.66 0.60  CALCIUM 8.3* 8.2*   LFT Recent Labs    01/07/20 0404  PROT 6.6  ALBUMIN 3.1*  AST 28  ALT 12  ALKPHOS 55  BILITOT 0.9   PT/INR Recent Labs    01/07/20 0404  LABPROT 13.3  INR 1.1    STUDIES: No results found.    Impression / Plan:   Assessment: Active Problems:   Symptomatic anemia   IDA (iron deficiency anemia)   Chronic systolic heart failure (HCC)   GAD (generalized anxiety disorder)   Generalized weakness   Acquired hypothyroidism   Cynthia Mcgrath is a 80 y.o. y/o female with with iron deficiency anemia that the patient reported to be symptomatic.  The patient has responded well to 1 unit of blood transfusion.  There is no sign of any overt GI bleeding such as black stools or bloody stools.  The patient also denies any change in bowel habits nausea vomiting fevers or chills.  The patient has not had a screening colonoscopy and reports that her last colonoscopy was over 20 years ago.  Plan:  This patient responded well to a blood transfusion with her hemoglobin being 8.6 up from 6.8.  The patient has no complaints at the present time.  The patient should undergo a work-up with an EGD and colonoscopy.  The patient has been set up for a EGD and colonoscopy for next week.  The patient will come to the office after she is discharged from the ER for instructions and the prep.  The patient and her daughter have been explained the plan  and agree with it.  Thank you for involving me in the care of this patient.      LOS: 0 days   Midge Minium, MD, [facility] 01/07/2020, 11:00 AM,  Pager [phone] 7am-5pm  Check AMION for 5pm -7am coverage and on weekends   Note: This dictation was prepared with Dragon dictation along with smaller phrase technology. Any transcriptional errors that result from this process are unintentional.

## 2020-01-08 ENCOUNTER — Other Ambulatory Visit
Admission: RE | Admit: 2020-01-08 | Discharge: 2020-01-08 | Disposition: A | Discharge status: Home / Self Care | Payer: Medicare HMO | Source: Ambulatory Visit | Attending: Gastroenterology | Admitting: Gastroenterology

## 2020-01-08 ENCOUNTER — Other Ambulatory Visit: Payer: Self-pay

## 2020-01-08 DIAGNOSIS — Z01812 Encounter for preprocedural laboratory examination: Secondary | ICD-10-CM | POA: Diagnosis not present

## 2020-01-08 DIAGNOSIS — Z20822 Contact with and (suspected) exposure to covid-19: Secondary | ICD-10-CM | POA: Insufficient documentation

## 2020-01-08 LAB — SARS CORONAVIRUS 2 (TAT 6-24 HRS): SARS Coronavirus 2: NEGATIVE

## 2020-01-11 ENCOUNTER — Encounter: Payer: Self-pay | Admitting: Gastroenterology

## 2020-01-12 ENCOUNTER — Ambulatory Visit
Admission: RE | Admit: 2020-01-12 | Discharge: 2020-01-12 | Disposition: A | Discharge status: Home / Self Care | Payer: Medicare HMO | Attending: Gastroenterology | Admitting: Gastroenterology

## 2020-01-12 ENCOUNTER — Encounter: Payer: Self-pay | Admitting: Gastroenterology

## 2020-01-12 ENCOUNTER — Ambulatory Visit: Payer: Medicare HMO | Admitting: Anesthesiology

## 2020-01-12 ENCOUNTER — Encounter
Admission: RE | Disposition: A | Discharge status: Home / Self Care | Payer: Self-pay | Source: Home / Self Care | Attending: Gastroenterology

## 2020-01-12 ENCOUNTER — Other Ambulatory Visit: Payer: Self-pay

## 2020-01-12 DIAGNOSIS — Z885 Allergy status to narcotic agent status: Secondary | ICD-10-CM | POA: Insufficient documentation

## 2020-01-12 DIAGNOSIS — D500 Iron deficiency anemia secondary to blood loss (chronic): Secondary | ICD-10-CM

## 2020-01-12 DIAGNOSIS — K317 Polyp of stomach and duodenum: Secondary | ICD-10-CM

## 2020-01-12 DIAGNOSIS — D490 Neoplasm of unspecified behavior of digestive system: Secondary | ICD-10-CM | POA: Diagnosis not present

## 2020-01-12 DIAGNOSIS — Z79899 Other long term (current) drug therapy: Secondary | ICD-10-CM | POA: Diagnosis not present

## 2020-01-12 DIAGNOSIS — K5730 Diverticulosis of large intestine without perforation or abscess without bleeding: Secondary | ICD-10-CM | POA: Insufficient documentation

## 2020-01-12 DIAGNOSIS — Z79890 Hormone replacement therapy (postmenopausal): Secondary | ICD-10-CM | POA: Insufficient documentation

## 2020-01-12 DIAGNOSIS — K222 Esophageal obstruction: Secondary | ICD-10-CM | POA: Diagnosis not present

## 2020-01-12 DIAGNOSIS — K449 Diaphragmatic hernia without obstruction or gangrene: Secondary | ICD-10-CM | POA: Insufficient documentation

## 2020-01-12 DIAGNOSIS — I5022 Chronic systolic (congestive) heart failure: Secondary | ICD-10-CM | POA: Diagnosis not present

## 2020-01-12 DIAGNOSIS — K6389 Other specified diseases of intestine: Secondary | ICD-10-CM | POA: Diagnosis not present

## 2020-01-12 DIAGNOSIS — D509 Iron deficiency anemia, unspecified: Secondary | ICD-10-CM

## 2020-01-12 HISTORY — PX: COLONOSCOPY WITH PROPOFOL: SHX5780

## 2020-01-12 HISTORY — DX: Depression, unspecified: F32.A

## 2020-01-12 HISTORY — DX: Anxiety disorder, unspecified: F41.9

## 2020-01-12 HISTORY — PX: ESOPHAGOGASTRODUODENOSCOPY (EGD) WITH PROPOFOL: SHX5813

## 2020-01-12 HISTORY — DX: Gastro-esophageal reflux disease without esophagitis: K21.9

## 2020-01-12 HISTORY — DX: Neoplasm of unspecified behavior of brain: D49.6

## 2020-01-12 SURGERY — COLONOSCOPY WITH PROPOFOL
Anesthesia: General

## 2020-01-12 MED ORDER — PROPOFOL 10 MG/ML IV BOLUS
INTRAVENOUS | Status: AC
  Filled 2020-01-12: qty 20

## 2020-01-12 MED ORDER — PROPOFOL 500 MG/50ML IV EMUL
INTRAVENOUS | Status: DC | PRN
  Administered 2020-01-12: 100 ug/kg/min via INTRAVENOUS

## 2020-01-12 MED ORDER — LIDOCAINE HCL (PF) 2 % IJ SOLN
INTRAMUSCULAR | Status: AC
  Filled 2020-01-12: qty 5

## 2020-01-12 MED ORDER — LIDOCAINE HCL (CARDIAC) PF 100 MG/5ML IV SOSY
PREFILLED_SYRINGE | INTRAVENOUS | Status: DC | PRN
  Administered 2020-01-12: 40 mg via INTRAVENOUS
  Administered 2020-01-12: 60 mg via INTRAVENOUS

## 2020-01-12 MED ORDER — ONDANSETRON HCL 4 MG/2ML IJ SOLN
INTRAMUSCULAR | Status: DC | PRN
  Administered 2020-01-12: 4 mg via INTRAVENOUS

## 2020-01-12 MED ORDER — PROPOFOL 10 MG/ML IV BOLUS
INTRAVENOUS | Status: DC | PRN
  Administered 2020-01-12: 20 mg via INTRAVENOUS
  Administered 2020-01-12: 10 mg via INTRAVENOUS
  Administered 2020-01-12: 50 mg via INTRAVENOUS
  Administered 2020-01-12: 20 mg via INTRAVENOUS

## 2020-01-12 MED ORDER — SODIUM CHLORIDE 0.9 % IV SOLN: INTRAVENOUS | Status: DC

## 2020-01-12 NOTE — Op Note (Signed)
[facility] Patient Name: Cynthia Mcgrath Procedure Date: 01/12/2020 12:10 PM MRN: [id number] Account #: [id number] Date of Birth: 1940/05/20 Admit Type: Outpatient Age: 80 Room: [facility] ENDO ROOM 4 Gender: Female Note Status: Finalized Procedure:             Upper GI endoscopy Indications:           Iron deficiency anemia Providers:             Lucilla Lame MD, MD Medicines:             Propofol per Anesthesia Complications:         No immediate complications. Procedure:             Pre-Anesthesia Assessment:                        - Prior to the procedure, a History and Physical was                         performed, and patient medications and allergies were                         reviewed. The patient's tolerance of previous                         anesthesia was also reviewed. The risks and benefits                         of the procedure and the sedation options and risks                         were discussed with the patient. All questions were                         answered, and informed consent was obtained. Prior                         Anticoagulants: The patient has taken no previous                         anticoagulant or antiplatelet agents. ASA Grade                         Assessment: II - A patient with mild systemic disease.                         After reviewing the risks and benefits, the patient                         was deemed in satisfactory condition to undergo the                         procedure.                        After obtaining informed consent, the endoscope was                         passed under direct vision. Throughout the procedure,  the patient's blood pressure, pulse, and oxygen                         saturations were monitored continuously. The Endoscope                         was introduced through the mouth, and advanced to the                         second part of  duodenum. The upper GI endoscopy was                         accomplished without difficulty. The patient tolerated                         the procedure well. Findings:      A small hiatal hernia was present.      Multiple pedunculated and sessile polyps with no bleeding and no       stigmata of recent bleeding were found in the gastric fundus and in the       gastric body. Biopsies were taken with a cold forceps for histology.      The examined duodenum was normal. Impression:            - Small hiatal hernia.                        - Multiple gastric polyps. Biopsied.                        - Normal examined duodenum. Recommendation:        - Discharge patient to home.                        - Resume previous diet.                        - Continue present medications.                        - Await pathology results.                        - Perform a colonoscopy today. Procedure Code(s):     --- Professional ---                        [phone], Esophagogastroduodenoscopy, flexible,                         transoral; with biopsy, single or multiple Diagnosis Code(s):     --- Professional ---                        D50.9, Iron deficiency anemia, unspecified                        K31.7, Polyp of stomach and duodenum CPT copyright 2019 American Medical Association. All rights reserved. The codes documented in this report are preliminary and upon coder review may  be revised to meet current compliance requirements. Lucilla Lame MD, MD 01/12/2020 12:24:47 PM This report has been signed  electronically. Number of Addenda: 0 Note Initiated On: 01/12/2020 12:10 PM Estimated Blood Loss:  Estimated blood loss: none.      [facility]

## 2020-01-12 NOTE — Transfer of Care (Signed)
Immediate Anesthesia Transfer of Care Note  Patient: Cynthia Mcgrath  Procedure(s) Performed: COLONOSCOPY WITH PROPOFOL (N/A ) ESOPHAGOGASTRODUODENOSCOPY (EGD) WITH PROPOFOL (N/A )  Patient Location: PACU and Endoscopy Unit  Anesthesia Type:General  Level of Consciousness: drowsy and patient cooperative  Airway & Oxygen Therapy: Patient Spontanous Breathing  Post-op Assessment: Report given to RN and Post -op Vital signs reviewed and stable  Post vital signs: Reviewed and stable  Last Vitals:  Vitals Value Taken Time  BP    Temp    Pulse    Resp    SpO2      Last Pain:  Vitals:   01/12/20 1103  TempSrc: Temporal         Complications: No complications documented.

## 2020-01-12 NOTE — Interval H&P Note (Signed)
Lucilla Lame, MD [facility] [address], [place],  [address] Phone:[phone] Fax : [phone]  Primary Care Physician:  Baxter Hire, MD Primary Gastroenterologist:  Dr. Allen Norris  Pre-Procedure History & Physical: HPI:  KEYSI OELKERS is a 80 y.o. female is here for an endoscopy and colonoscopy.   Past Medical History:  Diagnosis Date   Chronic systolic heart failure ([place])     Past Surgical History:  Procedure Laterality Date   BREAST EXCISIONAL BIOPSY Bilateral 1980's   NEG    Prior to Admission medications   Medication Sig Start Date End Date Taking? Authorizing Provider  buPROPion (WELLBUTRIN XL) 300 MG 24 hr tablet Take 300 mg by mouth daily. 09/12/19  Yes [provider]  levothyroxine (SYNTHROID) 100 MCG tablet Take 100 mcg by mouth daily before breakfast.   Yes [provider]  LORazepam (ATIVAN) 1 MG tablet Take 1 mg by mouth 3 (three) times daily as needed for anxiety. 09/12/19  Yes [provider]  omeprazole (PRILOSEC) 20 MG capsule Take 20 mg by mouth daily.   Yes [provider]  sertraline (ZOLOFT) 100 MG tablet Take 150 mg by mouth daily.   Yes [provider]  carvedilol (COREG) 6.25 MG tablet Take 6.25 mg by mouth 2 (two) times daily with a meal.    [provider]  ferrous sulfate 325 (65 FE) MG tablet Take 1 tablet (325 mg total) by mouth every other day. 01/07/20 03/07/20  Jennye Boroughs, MD  furosemide (LASIX) 20 MG tablet Take 20 mg by mouth daily as needed for edema or fluid.    [provider]  losartan (COZAAR) 25 MG tablet Take 25 mg by mouth daily.    [provider]    Allergies as of 01/07/2020 - Review Complete 01/07/2020  Allergen Reaction Noted   Codeine Itching 06/08/2017    Family History  Problem Relation Age of Onset   Breast cancer Cousin 38    Social History   Socioeconomic History   Marital status: Widowed    Spouse name: Not on file   Number of  children: Not on file   Years of education: Not on file   Highest education level: Not on file  Occupational History   Not on file  Tobacco Use   Smoking status: Never Smoker   Smokeless tobacco: Never Used  Substance and Sexual Activity   Alcohol use: Not Currently   Drug use: Never   Sexual activity: Not on file  Other Topics Concern   Not on file  Social History Narrative   Not on file   Social Determinants of Health   Financial Resource Strain: Not on file  Food Insecurity: Not on file  Transportation Needs: Not on file  Physical Activity: Not on file  Stress: Not on file  Social Connections: Not on file  Intimate Partner Violence: Not on file    Review of Systems: See HPI, otherwise negative ROS  Physical Exam: There were no vitals taken for this visit. General:   Alert,  pleasant and cooperative in NAD Head:  Normocephalic and atraumatic. Neck:  Supple; no masses or thyromegaly. Lungs:  Clear throughout to auscultation.    Heart:  Regular rate and rhythm. Abdomen:  Soft, nontender and nondistended. Normal bowel sounds, without guarding, and without rebound.   Neurologic:  Alert and  oriented x4;  grossly normal neurologically.  Impression/Plan: SENOVIA GAUER is here for an endoscopy and colonoscopy to be performed for  iron def. anemia  Risks, benefits, limitations, and alternatives regarding  endoscopy and colonoscopy have been reviewed with the patient.  Questions have been answered.  All parties agreeable.   Lucilla Lame, MD  01/12/2020, 10:56 AM

## 2020-01-12 NOTE — Anesthesia Postprocedure Evaluation (Signed)
Anesthesia Post Note  Patient: Cynthia Mcgrath  Procedure(s) Performed: COLONOSCOPY WITH PROPOFOL (N/A ) ESOPHAGOGASTRODUODENOSCOPY (EGD) WITH PROPOFOL (N/A )  Patient location during evaluation: Endoscopy Anesthesia Type: General Level of consciousness: awake Pain management: pain level controlled Vital Signs Assessment: post-procedure vital signs reviewed and stable Respiratory status: spontaneous breathing Cardiovascular status: stable Postop Assessment: no apparent nausea or vomiting Anesthetic complications: no   No complications documented.   Last Vitals:  Vitals:   01/12/20 1248 01/12/20 1258  BP: 140/76 (!) 143/77  Pulse: 84 82  Resp: 14 (!) 21  Temp:    SpO2: 96% 94%    Last Pain:  Vitals:   01/12/20 1258  TempSrc:   PainSc: 0-No pain                 Neva Seat

## 2020-01-12 NOTE — Op Note (Addendum)
[facility] Gastroenterology Patient Name: Cynthia Mcgrath Procedure Date: 01/12/2020 12:10 PM MRN: [id number] Account #: [id number] Date of Birth: 28-Dec-1940 Admit Type: Outpatient Age: 80 Room: [facility] ENDO ROOM 4 Gender: Female Note Status: Finalized Procedure:             Colonoscopy Indications:           Iron deficiency anemia Providers:             Lucilla Lame MD, MD Medicines:             Propofol per Anesthesia Complications:         No immediate complications. Procedure:             Pre-Anesthesia Assessment:                        - Prior to the procedure, a History and Physical was                         performed, and patient medications and allergies were                         reviewed. The patient's tolerance of previous                         anesthesia was also reviewed. The risks and benefits                         of the procedure and the sedation options and risks                         were discussed with the patient. All questions were                         answered, and informed consent was obtained. Prior                         Anticoagulants: The patient has taken no previous                         anticoagulant or antiplatelet agents. ASA Grade                         Assessment: II - A patient with mild systemic disease.                         After reviewing the risks and benefits, the patient                         was deemed in satisfactory condition to undergo the                         procedure.                        After obtaining informed consent, the colonoscope was                         passed under direct vision. Throughout the procedure,  the patient's blood pressure, pulse, and oxygen                         saturations were monitored continuously. The                         Colonoscope was introduced through the anus and                         advanced to the the cecum, identified by  appendiceal                         orifice and ileocecal valve. The colonoscopy was                         performed without difficulty. The patient tolerated                         the procedure well. The quality of the bowel                         preparation was excellent. Findings:      The perianal and digital rectal examinations were normal.      A 4 mm polyp was found in the ascending colon. The polyp was sessile. No       biopsies or other specimens were collected for this exam.      A fungating non-obstructing large mass was found at the ileocecal valve.       The mass was non-circumferential. No bleeding was present. This was       biopsied with a cold forceps for histology. Area was successfully       injected with 3 mL [place] ink for tattooing.      Multiple small-mouthed diverticula were found in the sigmoid colon. Impression:            - One 4 mm polyp in the ascending colon. No specimens                         collected.                        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                         Biopsied. Injected.                        - Diverticulosis in the sigmoid colon. Recommendation:        - Discharge patient to home.                        - Resume previous diet.                        - Continue present medications.                        - Await pathology results.                        - Refer to a  Psychologist, sport and exercise. Procedure Code(s):     --- Professional ---                        [phone], Colonoscopy, flexible; with directed submucosal                         injection(s), any substance                        [id number], Colonoscopy, flexible; with biopsy, single or                         multiple Diagnosis Code(s):     --- Professional ---                        D50.9, Iron deficiency anemia, unspecified                        D49.0, Neoplasm of unspecified behavior of digestive                         system CPT copyright 2019 American  Medical Association. All rights reserved. The codes documented in this report are preliminary and upon coder review may  be revised to meet current compliance requirements. Lucilla Lame MD, MD 01/12/2020 12:45:56 PM This report has been signed electronically. Number of Addenda: 0 Note Initiated On: 01/12/2020 12:10 PM Scope Withdrawal Time: 0 hours 5 minutes 9 seconds  Total Procedure Duration: 0 hours 17 minutes 24 seconds  Estimated Blood Loss:  Estimated blood loss: none.      [facility]

## 2020-01-12 NOTE — Anesthesia Preprocedure Evaluation (Signed)
Anesthesia Evaluation  Patient identified by MRN, date of birth, ID band Patient awake    Reviewed: Allergy & Precautions, NPO status , Patient's Chart, lab work & pertinent test results  History of Anesthesia Complications Negative for: history of anesthetic complications  Airway Mallampati: II       Dental   Pulmonary neg sleep apnea, neg COPD, Not current smoker,           Cardiovascular (-) hypertension(-) Past MI and (-) CHF (-) dysrhythmias (-) Valvular Problems/Murmurs     Neuro/Psych neg Seizures Anxiety Depression    GI/Hepatic Neg liver ROS, neg GERD  ,  Endo/Other  Hypothyroidism   Renal/GU negative Renal ROS     Musculoskeletal   Abdominal   Peds  Hematology  (+) anemia ,   Anesthesia Other Findings   Reproductive/Obstetrics                             Anesthesia Physical Anesthesia Plan  ASA: II  Anesthesia Plan: General   Post-op Pain Management:    Induction: Intravenous  PONV Risk Score and Plan: 3 and Propofol infusion and TIVA  Airway Management Planned: Nasal Cannula  Additional Equipment:   Intra-op Plan:   Post-operative Plan:   Informed Consent: I have reviewed the patients History and Physical, chart, labs and discussed the procedure including the risks, benefits and alternatives for the proposed anesthesia with the patient or authorized representative who has indicated his/her understanding and acceptance.       Plan Discussed with:   Anesthesia Plan Comments:         Anesthesia Quick Evaluation

## 2020-01-13 ENCOUNTER — Encounter: Payer: Self-pay | Admitting: Gastroenterology

## 2020-01-13 ENCOUNTER — Other Ambulatory Visit: Payer: Self-pay | Admitting: Gastroenterology

## 2020-01-13 LAB — SURGICAL PATHOLOGY

## 2020-01-14 ENCOUNTER — Other Ambulatory Visit: Payer: Self-pay

## 2020-01-14 ENCOUNTER — Telehealth: Payer: Self-pay

## 2020-01-14 DIAGNOSIS — K6389 Other specified diseases of intestine: Secondary | ICD-10-CM

## 2020-01-14 NOTE — Telephone Encounter (Signed)
-----   Message from Lucilla Lame, MD sent at 01/14/2020  7:03 AM EST ----- This patient was supposed to be set up for surgery due to a cecal mass that was supposed be scheduled from endoscopy.  It appears that her next appointment with me on January 20th.  The patient does not need to wait until then for her mass and needs to be seen by surgery.

## 2020-01-14 NOTE — Telephone Encounter (Signed)
Pt scheduled with Dr. Dahlia Byes on Monday, Jan 17th at 3:00pm. Pt has been notified of the appt date and time.

## 2020-01-18 ENCOUNTER — Ambulatory Visit: Payer: Self-pay | Admitting: Surgery

## 2020-01-20 ENCOUNTER — Other Ambulatory Visit: Payer: Self-pay

## 2020-01-20 ENCOUNTER — Ambulatory Visit (INDEPENDENT_AMBULATORY_CARE_PROVIDER_SITE_OTHER): Payer: Medicare HMO | Admitting: Surgery

## 2020-01-20 ENCOUNTER — Telehealth: Payer: Self-pay

## 2020-01-20 ENCOUNTER — Encounter: Payer: Self-pay | Admitting: Surgery

## 2020-01-20 VITALS — BP 147/91 | HR 94 | Temp 99.0°F | Ht 60.0 in | Wt 144.4 lb

## 2020-01-20 DIAGNOSIS — C189 Malignant neoplasm of colon, unspecified: Secondary | ICD-10-CM | POA: Diagnosis not present

## 2020-01-20 DIAGNOSIS — K6389 Other specified diseases of intestine: Secondary | ICD-10-CM | POA: Diagnosis not present

## 2020-01-20 NOTE — Telephone Encounter (Signed)
Cardiac Clearance faxed to Dr.Kenneth Fath at this time.

## 2020-01-20 NOTE — Patient Instructions (Addendum)
Our surgery scheduler will call you within 24-48 hours to schedule your surgery. Please have the [place] surgery sheet available when speaking with her.      Please go to the lab today to have your blood drawn at [facility].  Please enter through the [place] entrance of [facility].    CT Abdomen and Pelvis and Chest is scheduled 01/26/2020 @ 10:45 am. Do not eat or drink anything 4 hours prior to having the scan done.  [facility] across from [place]. Enter through the main entrance and stay to your left-the Radiology department will be down the hall on your left.    Please pick up your prep kit at the Radiology desk after you have your lab work done.   Please see your follow up appointment listed below.

## 2020-01-21 ENCOUNTER — Telehealth: Payer: Self-pay | Admitting: Surgery

## 2020-01-21 ENCOUNTER — Other Ambulatory Visit
Admission: RE | Admit: 2020-01-21 | Discharge: 2020-01-21 | Disposition: A | Discharge status: Home / Self Care | Payer: Medicare HMO | Source: Ambulatory Visit | Attending: Surgery | Admitting: Surgery

## 2020-01-21 DIAGNOSIS — C189 Malignant neoplasm of colon, unspecified: Secondary | ICD-10-CM | POA: Diagnosis not present

## 2020-01-21 DIAGNOSIS — K6389 Other specified diseases of intestine: Secondary | ICD-10-CM

## 2020-01-21 LAB — COMPREHENSIVE METABOLIC PANEL
ALT: 11 U/L (ref 0–44)
AST: 20 U/L (ref 15–41)
Albumin: 3.2 g/dL — ABNORMAL LOW (ref 3.5–5.0)
Alkaline Phosphatase: 57 U/L (ref 38–126)
Anion gap: 12 (ref 5–15)
BUN: 13 mg/dL (ref 8–23)
CO2: 26 mmol/L (ref 22–32)
Calcium: 8.6 mg/dL — ABNORMAL LOW (ref 8.9–10.3)
Chloride: 106 mmol/L (ref 98–111)
Creatinine, Ser: 0.82 mg/dL (ref 0.44–1.00)
GFR, Estimated: >60 mL/min (ref >60)
Glucose, Bld: 103 mg/dL — ABNORMAL HIGH (ref 70–99)
Potassium: 3.6 mmol/L (ref 3.5–5.1)
Sodium: 144 mmol/L (ref 135–145)
Total Bilirubin: 0.6 mg/dL (ref 0.3–1.2)
Total Protein: 6.5 g/dL (ref 6.5–8.1)

## 2020-01-21 NOTE — Progress Notes (Signed)
Patient ID: Cynthia Mcgrath, female   DOB: 1940/11/28, 80 y.o.   MRN: [id number]  HPI Cynthia Mcgrath DONE is a 80 y.o. female this is an 80 year old female seen in consultation at the request of Dr.Wohl for a newly diagnosed adenocarcinoma of the cecum.  She has been having dyspnea on exertion and fatigue related to significant anemia.  This prompted a colonoscopy.  I have personally reviewed the images of the colonoscopy showing a large cecal mass.  Biopsy also personally reviewed and discussed with the family showing adenocarcinoma.  Her more recent CMP shows normality across all the values except albumin that is 3.1.  Her CBC was normal except a hemoglobin of 8.6. She is very active and very functional.  She is able to perform more than 4 METS of activity without any shortness of breath or chest pain.  She is able to pay her bills.  She is able to cook she does have good sphincter control.  She still drives.  She is very competent and her mind is completely pristine.  She has been seen by cardiology due to dyspnea on exertion and this seems to be all related to her significant anemia.  She specifically denied any melena, weight loss or hematochezia. She has had a prior appendectomy in the remote past. She also had some point time a craniectomy and excision of brain tumor.  HPI  Past Medical History:  Diagnosis Date  . Anxiety   . Brain tumor ([place])   . Chronic systolic heart failure ([place])   . Depression   . GERD (gastroesophageal reflux disease)     Past Surgical History:  Procedure Laterality Date  . APPENDECTOMY    . BREAST EXCISIONAL BIOPSY Bilateral 1980's   NEG  . COLONOSCOPY    . COLONOSCOPY WITH PROPOFOL N/A 01/12/2020   Procedure: COLONOSCOPY WITH PROPOFOL;  Surgeon: Lucilla Lame, MD;  Location: [facility] ENDOSCOPY;  Service: Endoscopy;  Laterality: N/A;  . ESOPHAGOGASTRODUODENOSCOPY (EGD) WITH PROPOFOL N/A 01/12/2020   Procedure: ESOPHAGOGASTRODUODENOSCOPY (EGD) WITH PROPOFOL;  Surgeon:  Lucilla Lame, MD;  Location: [facility] ENDOSCOPY;  Service: Endoscopy;  Laterality: N/A;  . removal of brain tumor    . SHOULDER ARTHROSCOPY      Family History  Problem Relation Age of Onset  . Breast cancer Cousin 35  . Lung cancer Father     Social History Social History   Tobacco Use  . Smoking status: Never Smoker  . Smokeless tobacco: Never Used  Vaping Use  . Vaping Use: Never used  Substance Use Topics  . Alcohol use: Not Currently  . Drug use: Never    Allergies  Allergen Reactions  . Codeine Itching    Current Outpatient Medications  Medication Sig Dispense Refill  . buPROPion (WELLBUTRIN XL) 300 MG 24 hr tablet Take 300 mg by mouth daily.    . carvedilol (COREG) 6.25 MG tablet Take 6.25 mg by mouth 2 (two) times daily with a meal.    . furosemide (LASIX) 20 MG tablet Take 20 mg by mouth daily as needed for edema or fluid.    Marland Kitchen levothyroxine (SYNTHROID) 100 MCG tablet Take 100 mcg by mouth daily before breakfast.    . LORazepam (ATIVAN) 1 MG tablet Take 1 mg by mouth 3 (three) times daily as needed for anxiety.    Marland Kitchen losartan (COZAAR) 25 MG tablet Take 25 mg by mouth daily.    Marland Kitchen omeprazole (PRILOSEC) 20 MG capsule Take 20 mg by mouth every other day. Takes  opposite days of ferrous sulfate (iron)    . sertraline (ZOLOFT) 100 MG tablet Take 100 mg by mouth daily.    . ferrous sulfate 325 (65 FE) MG tablet Take 325 mg by mouth every other day. Takes opposite days of Prilosec     No current facility-administered medications for this visit.     Review of Systems Full ROS  was asked and was negative except for the information on the HPI  Physical Exam Blood pressure (!) 147/91, pulse 94, temperature 99 F (37.2 C), temperature source Oral, height 5' (1.524 m), weight 144 lb 6.4 oz (65.5 kg), SpO2 96 %. CONSTITUTIONAL: NAD EYES: Pupils are equal, round,, Sclera are non-icteric. EARS, NOSE, MOUTH AND THROAT:She is wearing a mask. Hearing is intact to voice. LYMPH  NODES:  Lymph nodes in the neck are normal. RESPIRATORY:  Lungs are clear. There is normal respiratory effort, with equal breath sounds bilaterally, and without pathologic use of accessory muscles. CARDIOVASCULAR: Heart is regular without murmurs, gallops, or rubs. GI: The abdomen is  soft, nontender, and nondistended. There are no palpable masses. There is no hepatosplenomegaly. There are normal bowel sounds in all quadrants. Prior appendectomy scar. GU: Rectal deferred.   MUSCULOSKELETAL: Normal muscle strength and tone. No cyanosis or edema.   SKIN: Turgor is good and there are no pathologic skin lesions or ulcers. NEUROLOGIC: Motor and sensation is grossly normal. Cranial nerves are grossly intact. PSYCH:  Oriented to person, place and time. Affect is normal.  Data Reviewed  I have personally reviewed the patient's imaging, laboratory findings and medical records.    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  I had an extensive discussion with the patient and her daughter about her disease process.  I have explained at length the diagnosis of cancer and the role for surgical therapy.  We will start the staging process with a CT scan of the abdomen and pelvis as well as the chest, a current CMP CBC and a CEA.  She wants me to go ahead and tentatively schedule her for surgery.  I do think that is reasonable as I do not think the staging will change the final course of surgical resection.  We had an extensive discussion regarding surgery.  I do think that she is a good candidate for robotic approach.  Procedure discussed with the patient in detail.  Risks, benefits and possible medications including but not limited to: Bleeding, infection anastomotic leak, need for ileostomy and prolonged postoperative course.  They are in agreement and very appreciative. Time spent with the patient was 65 minutes, with  more than 50% of the time spent in face-to-face education, counseling and care coordination.  I will see her back next week after she completes ordered w/u. We will also request cardiac preop optimizations given that she recently has seen them.  Caroleen Hamman, MD FACS General Surgeon 01/21/2020, 11:30 PM

## 2020-01-21 NOTE — Telephone Encounter (Signed)
Outgoing call is made, left message for patient's daughter, Vinnie Level to call.  Please advise patient of Pre-Admission date/time, COVID Testing date and Surgery date.  Surgery Date: 02/09/20 Preadmission Testing Date: 02/02/20 (phone 8a-1p) Covid Testing Date: 02/05/20 - patient advised to go to the [place] ([place]) between 8a-1p   Also patient to call [phone], between 1-3:00pm the day before surgery, to find out what time to arrive for surgery.

## 2020-01-22 ENCOUNTER — Telehealth: Payer: Self-pay | Admitting: Surgery

## 2020-01-22 LAB — CEA: CEA: 4.7 ng/mL (ref 0.0–4.7)

## 2020-01-22 NOTE — Telephone Encounter (Signed)
Outgoing call is made, spoke with daughter, Wynona Canes.  She had her mother are informed of Pre-Admission date/time, COVID Testing date and Surgery date.  Surgery Date: 02/09/20 Preadmission Testing Date: 02/02/20 (phone 8a-1p) Covid Testing Date: 02/05/20 - patient advised to go to the [place] ([place]) between 8a-1p  Also prep info is given.   Patient has been made aware to call [phone], between 1-3:00pm the day before surgery, to find out what time to arrive for surgery.

## 2020-01-22 NOTE — Telephone Encounter (Signed)
The patient's Ct scan has been rescheduled to [facility] on 01/25/20. She will arrive at the medical mall at 1:00 pm for a 1:30 pm scan. She will have nothing to eat or drink for 4 hours prior. She will drink her first bottle of contrast at 11:30 am and her second bottle at 12:30 pm. The patient and her daughter are aware of date, time, and instructions.

## 2020-01-25 ENCOUNTER — Ambulatory Visit: Payer: Medicare HMO

## 2020-01-25 ENCOUNTER — Ambulatory Visit
Admission: RE | Admit: 2020-01-25 | Discharge: 2020-01-25 | Disposition: A | Discharge status: Home / Self Care | Payer: Medicare HMO | Source: Ambulatory Visit | Attending: Surgery | Admitting: Surgery

## 2020-01-25 ENCOUNTER — Other Ambulatory Visit: Payer: Self-pay

## 2020-01-25 DIAGNOSIS — K6389 Other specified diseases of intestine: Secondary | ICD-10-CM | POA: Diagnosis not present

## 2020-01-25 DIAGNOSIS — J479 Bronchiectasis, uncomplicated: Secondary | ICD-10-CM | POA: Diagnosis not present

## 2020-01-25 DIAGNOSIS — C189 Malignant neoplasm of colon, unspecified: Secondary | ICD-10-CM | POA: Insufficient documentation

## 2020-01-25 DIAGNOSIS — I771 Stricture of artery: Secondary | ICD-10-CM | POA: Diagnosis not present

## 2020-01-25 DIAGNOSIS — K769 Liver disease, unspecified: Secondary | ICD-10-CM | POA: Diagnosis not present

## 2020-01-25 DIAGNOSIS — I708 Atherosclerosis of other arteries: Secondary | ICD-10-CM | POA: Diagnosis not present

## 2020-01-25 DIAGNOSIS — I700 Atherosclerosis of aorta: Secondary | ICD-10-CM | POA: Insufficient documentation

## 2020-01-25 DIAGNOSIS — D259 Leiomyoma of uterus, unspecified: Secondary | ICD-10-CM | POA: Diagnosis not present

## 2020-01-25 DIAGNOSIS — K5730 Diverticulosis of large intestine without perforation or abscess without bleeding: Secondary | ICD-10-CM | POA: Insufficient documentation

## 2020-01-25 DIAGNOSIS — D171 Benign lipomatous neoplasm of skin and subcutaneous tissue of trunk: Secondary | ICD-10-CM | POA: Diagnosis not present

## 2020-01-25 MED ORDER — IOHEXOL 300 MG/ML  SOLN
100.0000 mL | Freq: Once | INTRAMUSCULAR | Status: AC | PRN
  Administered 2020-01-25: 100 mL via INTRAVENOUS

## 2020-01-26 ENCOUNTER — Ambulatory Visit: Payer: Medicare HMO

## 2020-01-26 ENCOUNTER — Telehealth: Payer: Self-pay

## 2020-01-26 NOTE — Telephone Encounter (Signed)
Pt notified of CT results. Verbalizes understanding and will keep appt with Dr. Dahlia Byes on 01/27/20.

## 2020-01-27 ENCOUNTER — Encounter: Payer: Self-pay | Admitting: Surgery

## 2020-01-27 ENCOUNTER — Other Ambulatory Visit: Payer: Self-pay

## 2020-01-27 ENCOUNTER — Ambulatory Visit (INDEPENDENT_AMBULATORY_CARE_PROVIDER_SITE_OTHER): Payer: Medicare HMO | Admitting: Surgery

## 2020-01-27 VITALS — BP 151/84 | HR 93 | Temp 98.9°F | Ht 60.0 in | Wt 145.0 lb

## 2020-01-27 DIAGNOSIS — K6389 Other specified diseases of intestine: Secondary | ICD-10-CM

## 2020-01-27 DIAGNOSIS — C189 Malignant neoplasm of colon, unspecified: Secondary | ICD-10-CM | POA: Diagnosis not present

## 2020-01-27 MED ORDER — BISACODYL 5 MG PO TBEC: DELAYED_RELEASE_TABLET | ORAL | 0 refills | Status: DC

## 2020-01-27 MED ORDER — METRONIDAZOLE 500 MG PO TABS: ORAL_TABLET | ORAL | 0 refills | Status: DC

## 2020-01-27 MED ORDER — NEOMYCIN SULFATE 500 MG PO TABS: ORAL_TABLET | ORAL | 0 refills | Status: DC

## 2020-01-27 MED ORDER — POLYETHYLENE GLYCOL 3350 17 GM/SCOOP PO POWD: ORAL | 0 refills | Status: DC

## 2020-01-27 NOTE — Patient Instructions (Addendum)
Our surgery scheduler Pamala Hurry will contact you within the next 24-48 hours to discuss the preparation prior to surgery and also discuss the times to align with your schedule. Please have the BLUE sheet when she contacts you. If you have any questions or concerns, please do not hesitate to give our office a call. Patient advised to STOP Iron and continue with Prilosec. Colon Surgery Prep was provided for patient and her family at today's visit.  Minimally Invasive Total Colectomy  Minimally invasive total colectomy is surgery to remove all of the large intestine, also called the colon, leaving the small intestine and the rectum. This procedure uses several small incisions in the abdomen instead of one large incision. This procedure is also called laparoscopic total abdominal colectomy. Sometimes a robot can be used to control the tools used in the surgery. In such a case, the procedure is called robot-assisted laparoscopic surgery. This procedure may be used to treat several conditions that affect the colon, including:  Inflammation (diverticulitis).  Tumors or masses.  Inflammatory bowel disease, such as Crohn's disease or ulcerative colitis.  Bleeding.  Blockage or obstruction. Tell a health care provider about:  Any allergies you have.  All medicines you are taking, including vitamins, herbs, eye drops, creams, and over-the-counter medicines.  Any problems you or family members have had with anesthetic medicines.  Any blood disorders you have.  Any surgeries you have had.  Any medical conditions you have.  Whether you are pregnant or may be pregnant. What are the risks? Generally, this is a safe procedure. However, problems may occur, including:  Infection.  Bleeding.  Allergic reactions to medicines or dyes.  Damage to nearby structures or organs.  Leaking from where the small intestine and rectum were sewn together, if this applies.  Future blockage of the small  intestines from scar tissue. Another surgery may be needed to repair this.  Needing to convert to an open procedure. What happens before the procedure? Staying hydrated Follow instructions from your health care provider about hydration, which may include:  Up to 2 hours before the procedure - you may continue to drink clear liquids, such as water, clear fruit juice, black coffee, and plain tea.   Eating and drinking restrictions Follow instructions from your health care provider about eating and drinking, which may include:  8 hours before the procedure - stop eating meals with high fiber, and heavy meals or foods, such as meat, fried foods, or fatty foods.  6 hours before the procedure - stop eating light meals or foods, such as toast or cereal.  6 hours before the procedure - stop drinking milk or drinks that contain milk.  2 hours before the procedure - stop drinking clear liquids. Medicines Ask your health care provider about:  Changing or stopping your regular medicines. This is especially important if you are taking diabetes medicines or blood thinners.  Taking medicines such as aspirin and ibuprofen. These medicines can thin your blood. Do not take these medicines unless your health care provider tells you to take them.  Taking over-the-counter medicines, vitamins, herbs, and supplements. Surgery safety Ask your health care provider:  How your surgery site will be marked.  What steps will be taken to help prevent infection. These may include: ? Removing hair at the surgery site. ? Washing skin with a germ-killing soap. ? Taking antibiotic medicine. You may be given antibiotic medicine to clean out bacteria from your colon. Follow the directions carefully and take the medicine  at the correct time. General instructions  Do not use any products that contain nicotine or tobacco for at least 4 weeks before the procedure. These products include cigarettes, e-cigarettes, and  chewing tobacco. If you need help quitting, ask your health care provider.  Plan to have someone take you home from the hospital or clinic.  You may be prescribed an oral bowel prep to clean out your colon in preparation for the surgery. ? Follow instructions from your health care provider about how to do this. ? Do not eat or drink anything else after you have started the bowel prep, unless your health care provider tells you it is safe to do so. What happens during the procedure?  An IV will be inserted into one of your veins.  You will be given one or both of the following: ? A medicine to help you relax (sedative). ? A medicine to make you fall asleep (general anesthetic).  Small monitors will be connected to your body. They will be used to check your heart, blood pressure, and oxygen level.  A breathing tube may be placed into your lungs.  A small, thin tube (catheter) will be placed into your bladder to drain urine.  A tube may be placed through your nose and into your stomach to drain stomach fluids (nasogastric tube, or NG tube).  Your abdomen will be filled with gas so it expands. This gives the surgeon more room to operate and makes your organs easier to see.  Several small incisions will be made in your abdomen.  A thin tube with a light and camera (laparoscope) will be inserted into one of the incisions. The camera on the laparoscope will send a picture to a computer screen in the operating room. This will give the surgeon a good view inside your abdomen.  Hollow tubes will be inserted into the other small incisions in your abdomen. The tools that are needed for the procedure will be put through these tubes. A robot may also be used to control the instruments.  Clamps or staples will be put on the end of the small intestine and the end of the colon, at the rectum.  The part of the intestine between the clamps or staples will be removed.  The end of the small intestine  will be stitched (sutured) or stapled to the top of the rectum to allow your body to pass waste (stool).  If the remaining intestines cannot be stitched back together, you will have a procedure called an ileostomy. This creates a new opening for stool to leave your body. If you need an ileostomy: ? An opening to the outside of your body (stoma) will be made through your abdomen. ? The end of your small intestine will be brought to the opening. It will be sutured to the skin. ? A removable bag (ostomy pouch) will be attached to the opening. Stool will drain into this bag. ? The rectum will stay in place and will be closed with sutures or staples. ? The stoma may be temporary or permanent.  The incisions from the colectomy will be closed with sutures or staples. The procedure may vary among health care providers and hospitals.   What happens after the procedure?  Your blood pressure, heart rate, breathing rate, and blood oxygen level will be monitored until you leave the hospital or clinic.  You will receive fluids through an IV until your bowels start to work properly.  Once your bowels are working  again, you will be given clear liquids first and then solid food as tolerated.  You will be given medicines to control your pain and nausea, if needed. Summary  Minimally invasive total colectomy is surgery to remove all of the large intestine, or colon, from the abdomen.  Tell your health care provider about all medical conditions you have and all medicines you are taking for those conditions.  Follow all instructions before the procedure, including when to stop eating and drinking, and whether to change or stop any medicines.  The colon will be removed using small incisions. A robot is sometimes used to do the surgery.  Your surgeon will explain to you if the small intestine will be connected to the rectum, or if you will have a stoma to help you pass waste. This information is not intended  to replace advice given to you by your health care provider. Make sure you discuss any questions you have with your health care provider. Document Revised: 11/26/2018 Document Reviewed: 11/26/2018 Elsevier Patient Education  2021 [place] PREP Please follow the instructions carefully. It is important to clean out your bowels & take the prescribed antibiotic pills to lower your chances of a wound infection or abscess.   PRIOR TO YOUR SURGERY Stop eating any nuts, popcorn, or fruit with seeds. Stop all fiber supplements such as Metamucil, Citrucel, etc.   Hold taking any blood thinning anticoagulation medication (ex: aspirin, warfarin/Coumadin, Plavix, Xarelto, Eliquis, Pradaxa, etc) as recommended by your medical/cardiology doctor  Obtain what you need at a pharmacy of your choice: -Filled out prescriptions for your oral antibiotics (Neomycin & Metronidazole)  -A bottle of MiraLax / Glycolax (288g) - no prescription required  -A large bottle of Gatorade / Powerade (64oz)  -Dulcolax tablets (4 tabs) - no prescription required   DAY PRIOR TO SURGERY   7:00am Swallow 4 Dulcolax tablets with some water Drink plenty of clear liquids all day to avoid getting dehydrated (Water, juice, soda, coffee, tea, bouillon, jello, etc.)  10:00am Mix the bottle of MiraLax with the 64-oz bottle of Gatorade.  Drink the Gatorade mixture gradually over the next few hours (8oz glass every 15-30 minutes) until gone. You should finish by 2pm.  2:00pm Take 2 Neomycin 500mg  tablets & 2 Metronidazole 500mg  tablets  3:00pm Take 2 Neomycin 500mg  tablets & 2 Metronidazole 500mg  tablets  Drink plenty of clear liquids all evening to avoid getting dehydrated  10:00pm Take 2 Neomycin 500mg  tablets & 2 Metronidazole 500mg  tablets  Do not eat or drink anything after bedtime (midnight) the night before your surgery.   MORNING OF SURGERY Remember to not to drink or eat anything that morning  Hold  or take medications as recommended by the hospital staff at your Preoperative visit

## 2020-01-29 ENCOUNTER — Telehealth: Payer: Self-pay

## 2020-01-29 NOTE — Progress Notes (Signed)
Outpatient Surgical Follow Up  01/29/2020  Cynthia Mcgrath is an 80 y.o. female.   Chief Complaint  Patient presents with  . Follow-up    Follow up: Colon mass -discuss CT and labs    HPI: Cynthia Mcgrath is a 80 y.o. 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  She has completed her work-up including a CT scan of the abdomen pelvis and chest that I have personally reviewed.  There is no evidence of distant metastatic disease there is evidence of unknown cecal mass. She is very active and very functional.  She is able to perform more than 4 METS of activity without any shortness of breath or chest pain.  She is able to pay her bills.  She is able to cook she does have good sphincter control.  She still drives.  She is very competent and her mind is completely pristine.  She has been seen by cardiology due to dyspnea on exertion and this seems to be all related to her significant anemia.  She specifically denied any melena, weight loss or hematochezia. She has had a prior appendectomy in the remote past. She also had some point time a craniectomy and excision of brain tumor.   Past Medical History:  Diagnosis Date  . Anxiety   . Brain tumor ([place])   . Chronic systolic heart failure ([place])   . Depression   . GERD (gastroesophageal reflux disease)     Past Surgical History:  Procedure Laterality Date  . APPENDECTOMY    . BREAST EXCISIONAL BIOPSY Bilateral 1980's   NEG  . COLONOSCOPY    . COLONOSCOPY WITH PROPOFOL N/A 01/12/2020   Procedure: COLONOSCOPY WITH PROPOFOL;  Surgeon: Lucilla Lame, MD;  Location: [facility] ENDOSCOPY;  Service: Endoscopy;  Laterality: N/A;  . ESOPHAGOGASTRODUODENOSCOPY (EGD) WITH PROPOFOL N/A 01/12/2020   Procedure: ESOPHAGOGASTRODUODENOSCOPY (EGD) WITH PROPOFOL;  Surgeon: Lucilla Lame, MD;  Location: [facility] ENDOSCOPY;  Service: Endoscopy;  Laterality: N/A;  . removal of brain tumor    . SHOULDER ARTHROSCOPY       Family History  Problem Relation Age of Onset  . Breast cancer Cousin 60  . Lung cancer Father     Social History:  reports that she has never smoked. She has never used smokeless tobacco. She reports previous alcohol use. She reports that she does not use drugs.  Allergies:  Allergies  Allergen Reactions  . Codeine Itching    Medications reviewed.    ROS Full ROS performed and is otherwise negative other than what is stated in HPI   BP (!) 151/84   Pulse 93   Temp 98.9 F (37.2 C) (Oral)   Ht 5' (1.524 m)   Wt 145 lb (65.8 kg)   SpO2 97%   BMI 28.32 kg/m   Physical Exam Vitals and nursing note reviewed. Exam conducted with a chaperone present.  Constitutional:      General: She is not in acute distress.    Appearance: She is normal weight.  Eyes:     General:        Right eye: No discharge.        Left eye: No discharge.  Cardiovascular:     Rate and Rhythm: Normal rate and regular rhythm.  Pulmonary:     Effort: Pulmonary effort is normal. No respiratory distress.     Breath sounds: Normal breath sounds. No stridor.  Abdominal:     General: Abdomen is  flat. There is no distension.     Palpations: Abdomen is soft. There is no mass.     Tenderness: There is no abdominal tenderness. There is no rebound.     Hernia: No hernia is present.  Musculoskeletal:        General: No swelling. Normal range of motion.     Cervical back: Normal range of motion and neck supple. No rigidity or tenderness.  Skin:    General: Skin is warm and dry.     Capillary Refill: Capillary refill takes less than 2 seconds.  Neurological:     General: No focal deficit present.     Mental Status: She is alert and oriented to person, place, and time.  Psychiatric:        Mood and Affect: Mood normal.        Behavior: Behavior normal.        Thought Content: Thought content normal.        Judgment: Judgment normal.     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  Discussed with the patient in detail I do recommend proceeding with right hemicolectomy.  I do think that she is a very good candidate for robotic approach.  Procedure discussed with the patient in detail.  Risks, benefits and possible complications including but not limited to: Bleeding infection, anastomotic leak, possible reinterventions discussed with the patient and her daughter.  They are in agreement and wished to proceed.  I have also discussed with them in length the postoperative care outcomes and expectations.  We Have her scheduled for surgery in 2 weeks  Greater than 50% of the 45 minutes  visit was spent in counseling/coordination of care   Caroleen Hamman, MD [facility]

## 2020-01-29 NOTE — Telephone Encounter (Signed)
Spoke with receptionist at [facility] regarding Cardiac clearance-they will complete and send over.

## 2020-01-29 NOTE — H&P (View-Only) (Signed)
Outpatient Surgical Follow Up  01/29/2020  Cynthia Mcgrath is an 79 y.o. female.   Chief Complaint  Patient presents with  . Follow-up    Follow up: Colon mass -discuss CT and labs    HPI: Cynthia Mcgrath is a 79 y.o. 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  She has completed her work-up including a CT scan of the abdomen pelvis and chest that I have personally reviewed.  There is no evidence of distant metastatic disease there is evidence of unknown cecal mass. She is very active and very functional.  She is able to perform more than 4 METS of activity without any shortness of breath or chest pain.  She is able to pay her bills.  She is able to cook she does have good sphincter control.  She still drives.  She is very competent and her mind is completely pristine.  She has been seen by cardiology due to dyspnea on exertion and this seems to be all related to her significant anemia.  She specifically denied any melena, weight loss or hematochezia. She has had a prior appendectomy in the remote past. She also had some point time a craniectomy and excision of brain tumor.   Past Medical History:  Diagnosis Date  . Anxiety   . Brain tumor ([facility])   . Chronic systolic heart failure ([facility])   . Depression   . GERD (gastroesophageal reflux disease)     Past Surgical History:  Procedure Laterality Date  . APPENDECTOMY    . BREAST EXCISIONAL BIOPSY Bilateral 1980's   NEG  . COLONOSCOPY    . COLONOSCOPY WITH PROPOFOL N/A 01/12/2020   Procedure: COLONOSCOPY WITH PROPOFOL;  Surgeon: Wohl, Darren, MD;  Location: [facility] ENDOSCOPY;  Service: Endoscopy;  Laterality: N/A;  . ESOPHAGOGASTRODUODENOSCOPY (EGD) WITH PROPOFOL N/A 01/12/2020   Procedure: ESOPHAGOGASTRODUODENOSCOPY (EGD) WITH PROPOFOL;  Surgeon: Wohl, Darren, MD;  Location: [facility] ENDOSCOPY;  Service: Endoscopy;  Laterality: N/A;  . removal of brain tumor    . SHOULDER ARTHROSCOPY       Family History  Problem Relation Age of Onset  . Breast cancer Cousin 40  . Lung cancer Father     Social History:  reports that she has never smoked. She has never used smokeless tobacco. She reports previous alcohol use. She reports that she does not use drugs.  Allergies:  Allergies  Allergen Reactions  . Codeine Itching    Medications reviewed.    ROS Full ROS performed and is otherwise negative other than what is stated in HPI   BP (!) 151/84   Pulse 93   Temp 98.9 F (37.2 C) (Oral)   Ht 5' (1.524 m)   Wt 145 lb (65.8 kg)   SpO2 97%   BMI 28.32 kg/m   Physical Exam Vitals and nursing note reviewed. Exam conducted with a chaperone present.  Constitutional:      General: She is not in acute distress.    Appearance: She is normal weight.  Eyes:     General:        Right eye: No discharge.        Left eye: No discharge.  Cardiovascular:     Rate and Rhythm: Normal rate and regular rhythm.  Pulmonary:     Effort: Pulmonary effort is normal. No respiratory distress.     Breath sounds: Normal breath sounds. No stridor.  Abdominal:     General: Abdomen is   flat. There is no distension.     Palpations: Abdomen is soft. There is no mass.     Tenderness: There is no abdominal tenderness. There is no rebound.     Hernia: No hernia is present.  Musculoskeletal:        General: No swelling. Normal range of motion.     Cervical back: Normal range of motion and neck supple. No rigidity or tenderness.  Skin:    General: Skin is warm and dry.     Capillary Refill: Capillary refill takes less than 2 seconds.  Neurological:     General: No focal deficit present.     Mental Status: She is alert and oriented to person, place, and time.  Psychiatric:        Mood and Affect: Mood normal.        Behavior: Behavior normal.        Thought Content: Thought content normal.        Judgment: Judgment normal.     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  Discussed with the patient in detail I do recommend proceeding with right hemicolectomy.  I do think that she is a very good candidate for robotic approach.  Procedure discussed with the patient in detail.  Risks, benefits and possible complications including but not limited to: Bleeding infection, anastomotic leak, possible reinterventions discussed with the patient and her daughter.  They are in agreement and wished to proceed.  I have also discussed with them in length the postoperative care outcomes and expectations.  We Have her scheduled for surgery in 2 weeks  Greater than 50% of the 45 minutes  visit was spent in counseling/coordination of care   Caroleen Hamman, MD [facility]

## 2020-02-02 ENCOUNTER — Other Ambulatory Visit: Payer: Self-pay

## 2020-02-02 ENCOUNTER — Telehealth: Payer: Self-pay

## 2020-02-02 ENCOUNTER — Other Ambulatory Visit
Admission: RE | Admit: 2020-02-02 | Discharge: 2020-02-02 | Disposition: A | Discharge status: Home / Self Care | Payer: Medicare HMO | Source: Ambulatory Visit | Attending: Surgery | Admitting: Surgery

## 2020-02-02 DIAGNOSIS — Z01812 Encounter for preprocedural laboratory examination: Secondary | ICD-10-CM | POA: Diagnosis not present

## 2020-02-02 HISTORY — DX: Hypothyroidism, unspecified: E03.9

## 2020-02-02 HISTORY — DX: Cardiomyopathy, unspecified: I42.9

## 2020-02-02 HISTORY — DX: Malignant neoplasm of colon, unspecified: C18.9

## 2020-02-02 HISTORY — DX: Iron deficiency anemia, unspecified: D50.9

## 2020-02-02 HISTORY — DX: Benign neoplasm of meninges, unspecified: D32.9

## 2020-02-02 HISTORY — DX: Other cardiomyopathies: I42.8

## 2020-02-02 HISTORY — DX: Age-related osteoporosis without current pathological fracture: M81.0

## 2020-02-02 HISTORY — DX: Hyperlipidemia, unspecified: E78.5

## 2020-02-02 HISTORY — DX: Diffuse cystic mastopathy of unspecified breast: N60.19

## 2020-02-02 HISTORY — DX: Paralysis of vocal cords and larynx, unilateral: J38.01

## 2020-02-02 LAB — TYPE AND SCREEN
ABO/RH(D): A POS
Antibody Screen: NEGATIVE
Extend sample reason: TRANSFUSED

## 2020-02-02 LAB — CBC
HCT: 27.8 % — ABNORMAL LOW (ref 36.0–46.0)
Hemoglobin: 8.1 g/dL — ABNORMAL LOW (ref 12.0–15.0)
MCH: 22 pg — ABNORMAL LOW (ref 26.0–34.0)
MCHC: 29.1 g/dL — ABNORMAL LOW (ref 30.0–36.0)
MCV: 75.3 fL — ABNORMAL LOW (ref 80.0–100.0)
Platelets: 369 10*3/uL (ref 150–400)
RBC: 3.69 MIL/uL — ABNORMAL LOW (ref 3.87–5.11)
RDW: 21.2 % — ABNORMAL HIGH (ref 11.5–15.5)
WBC: 8.3 10*3/uL (ref 4.0–10.5)
nRBC: 0 % (ref 0.0–0.2)

## 2020-02-02 NOTE — Telephone Encounter (Signed)
Cardiac Clearance received from Dr.Fath-Low procedure risk assessment.

## 2020-02-02 NOTE — Progress Notes (Signed)
  [facility] Perioperative Services: Pre-Admission/Anesthesia Testing  Abnormal Lab Notification   Date: 02/02/20  Name: Cynthia Mcgrath MRN:   [id number]  Re: Abnormal labs noted during PAT appointment   Provider(s) Notified: Jules Husbands, MD and Baxter Hire, MD Notification mode: Routed and/or faxed via [facility] LAB VALUE(S): Lab Results  Component Value Date   HGB 8.1 (L) 02/02/2020   HCT 27.8 (L) 02/02/2020   MCV 75.3 (L) 02/02/2020   MCH 22.0 (L) 02/02/2020    Notes:  Patient is scheduled for a ROBOT ASSISTED RIGHT COLECTOMY on 02/09/2020.  In review of EMR, patient admitted on 01/06/2020 for symptomatic anemia. Received 1 unit PRBCs for a Hgb of 6.8 g/dL on 01/06/2020 and was admitted for further workup. Ferritin significantly low at 3 ng/mL; iron saturation 4% with a TIBC of 498 ug/dL. EGD and colonoscopy on 01/12/2020 revealed no stigmata of bleeding. Large mass at the ileocecal valve noted on endoscopy exam. 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  Patient was ultimately discharged home on 01/07/2020 to follow-up with outpatient surgery.  Hemoglobin 8.6 with a hematocrit of 29.0 on the day of discharge.   Repeat Hgb and Hct low today in PAT.  RBC indices are microcytic and hypochromic consistent with her known IDA.   Patient was started on oral iron (ferrous sulfate 325 mg QOD) prior to discharge. At time of PAT interview, patient was no longer taking the oral iron supplementation. Advised nurse that it was "giving her problem; ?? presumably constipation.    Type and screen obtained and on file for day of surgery.    Will forward to attending surgeon and patient's PCP for review.  This is a Community education officer; no formal response is required.  Honor Loh, MSN, APRN, FNP-C, CEN [facility]  Peri-operative Services Nurse Practitioner Phone: [phone] Fax: [phone] 02/02/20 3:42 PM

## 2020-02-02 NOTE — Patient Instructions (Addendum)
Your procedure is scheduled on: Tuesday, February 8 Report to the Registration Desk on the 1st floor of the [facility]. To find out your arrival time, please call [phone] between 1PM - 3PM on: Monday, February 7  REMEMBER: Instructions that are not followed completely may result in serious medical risk, up to and including death; or upon the discretion of your surgeon and anesthesiologist your surgery may need to be rescheduled.  Do not eat food after midnight the night before surgery.  No gum chewing, lozengers or hard candies.  You may however, drink CLEAR liquids up to 2 hours before you are scheduled to arrive for your surgery. Do not drink anything within 2 hours of your scheduled arrival time.  Clear liquids include: - water  - apple juice without pulp - gatorade (not RED, PURPLE, OR BLUE) - black coffee or tea (Do NOT add milk or creamers to the coffee or tea) Do NOT drink anything that is not on this list.  TAKE THESE MEDICATIONS THE MORNING OF SURGERY WITH A SIP OF WATER:  1.  Carvedilol 2.  Levothyroxine 3.  Omeprazole - (take one the night before and one on the morning of surgery - helps to prevent nausea after surgery.) 4.  Sertraline  One week prior to surgery: starting February 1 Stop Anti-inflammatories (NSAIDS) such as Advil, Aleve, Ibuprofen, Motrin, Naproxen, Naprosyn and Aspirin based products such as Excedrin, Goodys Powder, BC Powder. Stop ANY OVER THE COUNTER supplements until after surgery.  No Alcohol for 24 hours before or after surgery.  On the morning of surgery brush your teeth with toothpaste and water, you may rinse your mouth with mouthwash if you wish. Do not swallow any toothpaste or mouthwash.  Do not wear jewelry, make-up, hairpins, clips or nail polish.  Do not wear lotions, powders, or perfumes.   Do not shave body from the neck down 48 hours prior to surgery just in case you cut yourself which could leave a site for infection.   Also, freshly shaved skin may become irritated if using the CHG soap.  Contact lenses, hearing aids and dentures may not be worn into surgery.  Do not bring valuables to the hospital. [facility] is not responsible for any missing/lost belongings or valuables.   Use CHG Soap as directed on instruction sheet.  Bowel prep as directed by your surgeon.   Notify your doctor if there is any change in your medical condition (cold, fever, infection).  Wear comfortable clothing (specific to your surgery type) to the hospital.  Plan for stool softeners for home use; pain medications have a tendency to cause constipation. You can also help prevent constipation by eating foods high in fiber such as fruits and vegetables and drinking plenty of fluids as your diet allows.  After surgery, you can help prevent lung complications by doing breathing exercises.  Take deep breaths and cough every 1-2 hours. Your doctor may order a device called an Incentive Spirometer to help you take deep breaths. When coughing or sneezing, hold a pillow firmly against your incision with both hands. This is called "splinting." Doing this helps protect your incision. It also decreases belly discomfort.  If you are being admitted to the hospital overnight, leave your suitcase in the car. After surgery it may be brought to your room.  If you are being discharged the day of surgery, you will not be allowed to drive home. You will need a responsible adult (18 years or older) to drive  you home and stay with you that night.   If you are taking public transportation, you will need to have a responsible adult (18 years or older) with you. Please confirm with your physician that it is acceptable to use public transportation.   Please call the [facility] at [phone] if you have any questions about these instructions.  Visitation Policy:  Patients undergoing a surgery or procedure may have one family  member or support person with them as long as that person is not COVID-19 positive or experiencing its symptoms.  That person may remain in the waiting area during the procedure.  Inpatient Visitation:    Visiting hours are 7 a.m. to 8 p.m. Patients will be allowed one visitor. The visitor may change daily. The visitor must pass COVID-19 screenings, use hand sanitizer when entering and exiting the patient's room and wear a mask at all times, including in the patient's room. Patients must also wear a mask when staff or their visitor are in the room. Masking is required regardless of vaccination status. Systemwide, no visitors 17 or younger.

## 2020-02-04 ENCOUNTER — Encounter: Payer: Self-pay | Admitting: Surgery

## 2020-02-04 NOTE — Progress Notes (Signed)
[facility] Perioperative Services  Pre-Admission/Anesthesia Testing Clinical Review  Date: 02/04/20  Patient Demographics:  Name: Cynthia Mcgrath DOB:   1940-11-29 MRN:   [id number]  Planned Surgical Procedure(s):    Case: [id number] Date/Time: 02/09/20 0715   Procedure: XI ROBOT ASSISTED RIGHT COLECTOMY w/Adrianne Allred, RNFA to assist (N/A )   Anesthesia type: General   Pre-op diagnosis: colon cancer   Location: [facility] OR ROOM 04 / [facility]S FOR ANESTHESIA GROUP   Surgeons: Jules Husbands, MD    NOTE: Available PAT nursing documentation and vital signs have been reviewed. Clinical nursing staff has updated patient's PMH/PSHx, current medication list, and drug allergies/intolerances to ensure comprehensive history available to assist in medical decision making as it pertains to the aforementioned surgical procedure and anticipated anesthetic course.   Clinical Discussion:  Cynthia Mcgrath is a 80 y.o. female who is submitted for pre-surgical anesthesia review and clearance prior to her undergoing the above procedure. Patient has never been a smoker. Pertinent PMH includes: aortic atherosclerosis, CHF, idiopathic cardiomyopathy, valvular heart disease, HTN, HLD, hypothyroidism, vocal cord paralysis (right), acoustic meningioma, GERD (on daily PPI), IDA, depression, anxiety.  Patient is followed by cardiology Ubaldo Glassing, MD). She was last seen in the cardiology clinic on 01/05/2020; notes reviewed.  At the time of her clinic visit patient complained of several month history of weakness and exertional dyspnea. TTE performed on 11/04/2019 by her PCP revealed moderate left ventricular systolic dysfunction with a reduced EF of 40% and diffuse wall motion abnormalities.  Patient denied any angina, PND, orthopnea, palpitations, vertiginous symptoms, or presyncope/syncope.  Patient with occasional peripheral edema.  Myocardial perfusion imaging study performed on 12/29/2019 revealed an  LVEF of 61% with no evidence of stress-induced myocardial ischemia or arrhythmia (see full interpretation of cardiovascular testing below).  During patient's initial visit with cardiology back in 11/2019, she was started on beta-blocker therapy (carvedilol); tolerating well.  Blood pressure noted to be mildly elevated in clinic at 130/78.  Carvedilol dose increased, and patient was started on ARB (losartan) and daily loop diuretic (furosemide).  HLD is being managed with lifestyle and dietary modifications.  Given patient's unexplained symptoms, further evaluation was recommended via cardiac MRI, which was scheduled for 02/08/2020.  No other changes were made to patient's medication regimen.  Patient scheduled to follow-up with outpatient cardiology in 2 months or sooner if needed.  During routine blood work performed at patient's cardiology office, patient was found to be profoundly anemic.  Patient was referred to the ED.  Patient seen and evaluated on 01/06/2020 in the [facility] ED; notes reviewed.  Labs repeated and hemoglobin noted to be 6.8 g/dL.  Patient admitted to the hospital for stabilization and further work-up.  During her admission patient received 1 unit of PRBCs.  Ferritin found to be significantly low at 30 ng/mL.  Iron saturation 4% with a TIBC of 498 ug/dL. Patient improved following blood transfusion and was ultimately discharged home on 01/07/2020 to follow-up with outpatient GI. Patient underwent EGD and colonoscopy on 01/12/2020 that revealed no stigmata of bleeding, however there was a large mass noted at the ileocecal valve; area biopsied.  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  Patient was referred to general surgery for discussions regarding resection.  After meeting with general surgery, patient elected to move forward with colon resection; procedure scheduled.  Patient scheduled for robot-assisted colectomy on 02/09/2020  with Dr. Caroleen Hamman.  Given patient's past medical history significant  for cardiovascular disease, presurgical cardiac clearance was sought by the PAT team.  Per cardiology, "this patient is optimized for surgery and may proceed with the planned procedural course with a LOW risk stratification".  This patient is not on any type of daily anticoagulation/antiplatelet therapies.  She denies previous perioperative complications with anesthesia. She underwent a general anesthetic course here (ASA II) in 01/2020 with no documented complications.   Vitals with BMI 02/02/2020 01/27/2020 01/20/2020  Height 5\' 0"  5\' 0"  5\' 0"   Weight 138 lbs 145 lbs 144 lbs 6 oz  BMI 26.95 23.76 28.3  Systolic - 151 761  Diastolic - 84 91  Pulse - 93 94    Providers/Specialists:   NOTE: Primary physician provider listed below. Patient may have been seen by APP or partner within same practice.   PROVIDER ROLE / SPECIALTY LAST Suszanne Finch, MD General Surgery  01/27/2020  Baxter Hire, MD Primary Care Provider  10/29/2019  Bartholome Bill, MD Cardiology  01/05/2020   Allergies:  Codeine  Current Home Medications:   No current facility-administered medications for this encounter.   . furosemide (LASIX) 20 MG tablet  . levothyroxine (SYNTHROID) 100 MCG tablet  . LORazepam (ATIVAN) 1 MG tablet  . losartan (COZAAR) 25 MG tablet  . omeprazole (PRILOSEC) 20 MG capsule  . sertraline (ZOLOFT) 100 MG tablet  . bisacodyl (DULCOLAX) 5 MG EC tablet  . carvedilol (COREG) 3.125 MG tablet  . metroNIDAZOLE (FLAGYL) 500 MG tablet  . neomycin (MYCIFRADIN) 500 MG tablet  . polyethylene glycol powder (MIRALAX) 17 GM/SCOOP powder   History:   Past Medical History:  Diagnosis Date  . Anxiety   . Aortic atherosclerosis ([place])   . Brain tumor ([place])   . Breast fibrocystic disorder   . Chronic systolic heart failure ([place])   . Colon cancer ([place])   . Depression   . GERD (gastroesophageal reflux disease)   . HTN  (hypertension)   . Hyperlipidemia   . Hypothyroidism   . IDA (iron deficiency anemia)   . Idiopathic cardiomyopathy ([place])   . Meningioma ([place])    right acoustic  . Osteoporosis, post-menopausal   . Paralysis of right vocal cord    s/p right acoustic meningioma  . Sigmoid diverticulosis   . Valvular heart disease    Past Surgical History:  Procedure Laterality Date  . ACOUSTIC MENINGIOMA RESECTION Right 04/2004  . APPENDECTOMY    . BREAST EXCISIONAL BIOPSY Bilateral 1980's   NEG  . CATARACT EXTRACTION, BILATERAL Bilateral   . COLONOSCOPY    . COLONOSCOPY WITH PROPOFOL N/A 01/12/2020   Procedure: COLONOSCOPY WITH PROPOFOL;  Surgeon: Lucilla Lame, MD;  Location: [facility] ENDOSCOPY;  Service: Endoscopy;  Laterality: N/A;  . ESOPHAGOGASTRODUODENOSCOPY (EGD) WITH PROPOFOL N/A 01/12/2020   Procedure: ESOPHAGOGASTRODUODENOSCOPY (EGD) WITH PROPOFOL;  Surgeon: Lucilla Lame, MD;  Location: [facility] ENDOSCOPY;  Service: Endoscopy;  Laterality: N/A;  . HEMORROIDECTOMY    . IMPLANTATION VOCAL CORD Right   . removal of brain tumor    . SHOULDER ARTHROSCOPY Right    rotator cuff repair  . THYROID GROWTH REMOVAL  1985   Family History  Problem Relation Age of Onset  . Breast cancer Cousin 60  . Lung cancer Father   . Brain cancer Brother    Social History   Tobacco Use  . Smoking status: Never Smoker  . Smokeless tobacco: Never Used  Vaping Use  . Vaping Use: Never used  Substance Use Topics  . Alcohol use:  Not Currently  . Drug use: Never    Pertinent Clinical Results:  LABS: Labs reviewed: Acceptable for surgery.  Hospital Outpatient Visit on 02/02/2020  Component Date Value Ref Range Status  . WBC 02/02/2020 8.3  4.0 - 10.5 K/uL Final  . RBC 02/02/2020 3.69* 3.87 - 5.11 MIL/uL Final  . Hemoglobin 02/02/2020 8.1* 12.0 - 15.0 g/dL Final   Comment: Reticulocyte Hemoglobin testing may be clinically indicated, consider ordering this additional test [id number]   . HCT 02/02/2020 27.8*  36.0 - 46.0 % Final  . MCV 02/02/2020 75.3* 80.0 - 100.0 fL Final  . MCH 02/02/2020 22.0* 26.0 - 34.0 pg Final  . MCHC 02/02/2020 29.1* 30.0 - 36.0 g/dL Final  . RDW 02/02/2020 21.2* 11.5 - 15.5 % Final  . Platelets 02/02/2020 369  150 - 400 K/uL Final  . nRBC 02/02/2020 0.0  0.0 - 0.2 % Final   Performed at [facility], [address], [place], [place] [address]  . ABO/RH(D) 02/02/2020 A POS   Final  . Antibody Screen 02/02/2020 NEG   Final  . Sample Expiration 02/02/2020 02/05/2020,2359   Final  . Extend sample reason 02/02/2020    Final                   Value:TRANSFUSED IN PAST 3 MONTHS, UNABLE TO EXTEND Performed at [facility], [address] [place], [place] [address]       Component Value Date/Time   NA 144 01/21/2020 1105   K 3.6 01/21/2020 1105   CL 106 01/21/2020 1105   CO2 26 01/21/2020 1105   GLUCOSE 103 (H) 01/21/2020 1105   BUN 13 01/21/2020 1105   CREATININE 0.82 01/21/2020 1105   CALCIUM 8.6 (L) 01/21/2020 1105   PROT 6.5 01/21/2020 1105   ALBUMIN 3.2 (L) 01/21/2020 1105   AST 20 01/21/2020 1105   ALT 11 01/21/2020 1105   ALKPHOS 57 01/21/2020 1105   BILITOT 0.6 01/21/2020 1105   GFRNONAA >60 01/21/2020 1105   GFRAA >60 06/08/2017 1642     ECG: Date: 01/06/2020 Time ECG obtained: 0932 AM Rate: 88 bpm Rhythm: normal sinus Axis (leads I and aVF): Normal Intervals: PR 162 ms. QRS 92 ms. QTc 459 ms. ST segment and T wave changes: No evidence of acute ST segment elevation or depression Comparison: Similar to previous tracing obtained on 12/29/2019   IMAGING / PROCEDURES: CT CHEST, ABDOMEN, AND PELVIS WITH CONTRAST performed on 01/25/2020 1. 4.6 x 3.3 x 4.2 cm heterogeneous right colon mass, almost completely obstructing the lumen and with a small extension laterally that appears to extend through the wall of the colon. This is compatible with the patient's known primary colon carcinoma. 2. Two  mildly enlarged right lower quadrant  mesenteric lymph nodes, suspicious for metastatic adenopathy. 3. Two small, nonspecific liver lesions, as described above. These are too small to accurately characterize and most likely represent small cysts. Small metastases are less likely. 4. Sigmoid diverticulosis. 5. Changes of cylindrical bronchiectasis at the medial aspect of the right lower lobe. 6. 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 7. Calcified uterine fibroids. 8. Aortic atherosclerosis  LEXISCAN performed on 12/21/2019 1. LVEF 61% 2. Left ventricular function and cavity size normal 3. Regional wall motion reveals normal myocardial thickening and wall motion 4. No evidence of reversible ischemia 5. No artifacts noted 6. Overall quality of the study is good 7. Low risk study  ECHOCARDIOGRAM performed on 11/04/2019 1. LVEF 40% 2. Moderate left ventricular systolic dysfunction 3. Mild biatrial enlargement 4. Normal right ventricular systolic function 5. Mild AR, MR, TR 6. Trivial PR 7. No evidence of valvular stenosis 8. No evidence of pericardial effusion  Impression and Plan:  Cynthia Mcgrath has been referred for pre-anesthesia review and clearance prior to her undergoing the planned anesthetic and procedural courses. Available labs, pertinent testing, and imaging results were personally reviewed by me. This patient has been appropriately cleared by cardiology with an overall LOW risk for significant perioperative cardiovascular complications.   Anemia noted on labs performed in PAT. Patient transfused during last admission; see HPI.  Primary attending surgeon was notified regarding current hemoglobin and hematocrit levels. Active type and screen will be available for day of surgery. Based on clinical review performed today (02/04/20), barring any significant acute changes in the patient's overall condition, it is anticipated that she  will be able to proceed with the planned surgical intervention. Any acute changes in clinical condition may necessitate her procedure being postponed and/or cancelled. Pre-surgical instructions were reviewed with the patient during her PAT appointment and questions were fielded by PAT clinical staff.  Honor Loh, MSN, APRN, FNP-C, CEN [facility]  Peri-operative Services Nurse Practitioner Phone: [phone] 02/04/20 11:08 AM  NOTE: This note has been prepared using Dragon dictation software. Despite my best ability to proofread, there is always the potential that unintentional transcriptional errors may still occur from this process.

## 2020-02-05 ENCOUNTER — Other Ambulatory Visit
Admission: RE | Admit: 2020-02-05 | Discharge: 2020-02-05 | Disposition: A | Discharge status: Home / Self Care | Payer: Medicare HMO | Source: Ambulatory Visit | Attending: Surgery | Admitting: Surgery

## 2020-02-05 ENCOUNTER — Other Ambulatory Visit: Payer: Self-pay

## 2020-02-05 DIAGNOSIS — Z01812 Encounter for preprocedural laboratory examination: Secondary | ICD-10-CM | POA: Insufficient documentation

## 2020-02-05 DIAGNOSIS — Z20822 Contact with and (suspected) exposure to covid-19: Secondary | ICD-10-CM | POA: Insufficient documentation

## 2020-02-05 LAB — SARS CORONAVIRUS 2 (TAT 6-24 HRS): SARS Coronavirus 2: NEGATIVE

## 2020-02-08 ENCOUNTER — Ambulatory Visit (HOSPITAL_COMMUNITY): Admit: 2020-02-08 | Payer: Medicare HMO

## 2020-02-08 ENCOUNTER — Telehealth: Payer: Self-pay

## 2020-02-08 NOTE — Telephone Encounter (Signed)
Patient notified and stated she will begin her bowel prep today.

## 2020-02-09 ENCOUNTER — Encounter: Payer: Self-pay | Admitting: Surgery

## 2020-02-09 ENCOUNTER — Encounter
Admission: RE | Disposition: A | Discharge status: Home / Self Care | Payer: Self-pay | Source: Home / Self Care | Attending: Surgery

## 2020-02-09 ENCOUNTER — Other Ambulatory Visit: Payer: Self-pay

## 2020-02-09 ENCOUNTER — Inpatient Hospital Stay
Admission: RE | Admit: 2020-02-09 | Discharge: 2020-02-11 | DRG: 330 | Disposition: A | Discharge status: Home / Self Care | Payer: Medicare HMO | Attending: Surgery | Admitting: Surgery

## 2020-02-09 ENCOUNTER — Inpatient Hospital Stay: Payer: Medicare HMO | Admitting: Urgent Care

## 2020-02-09 DIAGNOSIS — Z79899 Other long term (current) drug therapy: Secondary | ICD-10-CM | POA: Diagnosis not present

## 2020-02-09 DIAGNOSIS — I700 Atherosclerosis of aorta: Secondary | ICD-10-CM | POA: Diagnosis not present

## 2020-02-09 DIAGNOSIS — E785 Hyperlipidemia, unspecified: Secondary | ICD-10-CM | POA: Diagnosis present

## 2020-02-09 DIAGNOSIS — Z9049 Acquired absence of other specified parts of digestive tract: Secondary | ICD-10-CM

## 2020-02-09 DIAGNOSIS — K6389 Other specified diseases of intestine: Secondary | ICD-10-CM

## 2020-02-09 DIAGNOSIS — E039 Hypothyroidism, unspecified: Secondary | ICD-10-CM | POA: Diagnosis not present

## 2020-02-09 DIAGNOSIS — I110 Hypertensive heart disease with heart failure: Secondary | ICD-10-CM | POA: Diagnosis present

## 2020-02-09 DIAGNOSIS — C182 Malignant neoplasm of ascending colon: Principal | ICD-10-CM | POA: Diagnosis present

## 2020-02-09 DIAGNOSIS — Z808 Family history of malignant neoplasm of other organs or systems: Secondary | ICD-10-CM

## 2020-02-09 DIAGNOSIS — D509 Iron deficiency anemia, unspecified: Secondary | ICD-10-CM | POA: Diagnosis present

## 2020-02-09 DIAGNOSIS — I428 Other cardiomyopathies: Secondary | ICD-10-CM | POA: Diagnosis present

## 2020-02-09 DIAGNOSIS — K219 Gastro-esophageal reflux disease without esophagitis: Secondary | ICD-10-CM | POA: Diagnosis not present

## 2020-02-09 DIAGNOSIS — Z801 Family history of malignant neoplasm of trachea, bronchus and lung: Secondary | ICD-10-CM

## 2020-02-09 DIAGNOSIS — Z86011 Personal history of benign neoplasm of the brain: Secondary | ICD-10-CM | POA: Diagnosis not present

## 2020-02-09 DIAGNOSIS — Z885 Allergy status to narcotic agent status: Secondary | ICD-10-CM

## 2020-02-09 DIAGNOSIS — C188 Malignant neoplasm of overlapping sites of colon: Secondary | ICD-10-CM

## 2020-02-09 DIAGNOSIS — Z803 Family history of malignant neoplasm of breast: Secondary | ICD-10-CM

## 2020-02-09 DIAGNOSIS — E876 Hypokalemia: Secondary | ICD-10-CM | POA: Diagnosis present

## 2020-02-09 DIAGNOSIS — M810 Age-related osteoporosis without current pathological fracture: Secondary | ICD-10-CM | POA: Diagnosis not present

## 2020-02-09 DIAGNOSIS — C189 Malignant neoplasm of colon, unspecified: Secondary | ICD-10-CM

## 2020-02-09 DIAGNOSIS — F419 Anxiety disorder, unspecified: Secondary | ICD-10-CM | POA: Diagnosis present

## 2020-02-09 DIAGNOSIS — Z79890 Hormone replacement therapy (postmenopausal): Secondary | ICD-10-CM | POA: Diagnosis not present

## 2020-02-09 DIAGNOSIS — F32A Depression, unspecified: Secondary | ICD-10-CM | POA: Diagnosis present

## 2020-02-09 DIAGNOSIS — I5022 Chronic systolic (congestive) heart failure: Secondary | ICD-10-CM | POA: Diagnosis present

## 2020-02-09 DIAGNOSIS — F411 Generalized anxiety disorder: Secondary | ICD-10-CM | POA: Diagnosis not present

## 2020-02-09 HISTORY — DX: Essential (primary) hypertension: I10

## 2020-02-09 HISTORY — DX: Atherosclerosis of aorta: I70.0

## 2020-02-09 HISTORY — DX: Endocarditis, valve unspecified: I38

## 2020-02-09 HISTORY — DX: Diverticulosis of large intestine without perforation or abscess without bleeding: K57.30

## 2020-02-09 LAB — CBC
HCT: 24.7 % — ABNORMAL LOW (ref 36.0–46.0)
Hemoglobin: 7.2 g/dL — ABNORMAL LOW (ref 12.0–15.0)
MCH: 21.7 pg — ABNORMAL LOW (ref 26.0–34.0)
MCHC: 29.1 g/dL — ABNORMAL LOW (ref 30.0–36.0)
MCV: 74.4 fL — ABNORMAL LOW (ref 80.0–100.0)
Platelets: 293 10*3/uL (ref 150–400)
RBC: 3.32 MIL/uL — ABNORMAL LOW (ref 3.87–5.11)
RDW: 20.3 % — ABNORMAL HIGH (ref 11.5–15.5)
WBC: 11.3 10*3/uL — ABNORMAL HIGH (ref 4.0–10.5)
nRBC: 0 % (ref 0.0–0.2)

## 2020-02-09 LAB — CREATININE, SERUM
Creatinine, Ser: 0.89 mg/dL (ref 0.44–1.00)
GFR, Estimated: >60 mL/min (ref >60)

## 2020-02-09 LAB — TYPE AND SCREEN
ABO/RH(D): A POS
Antibody Screen: NEGATIVE

## 2020-02-09 SURGERY — COLECTOMY, RIGHT, ROBOT-ASSISTED
Anesthesia: General

## 2020-02-09 MED ORDER — SPY AGENT GREEN - (INDOCYANINE FOR INJECTION)
INTRAMUSCULAR | Status: DC | PRN
  Administered 2020-02-09 (×2): 2 mL via INTRAVENOUS

## 2020-02-09 MED ORDER — SUGAMMADEX SODIUM 200 MG/2ML IV SOLN
INTRAVENOUS | Status: DC | PRN
  Administered 2020-02-09: 200 mg via INTRAVENOUS

## 2020-02-09 MED ORDER — FAMOTIDINE 20 MG PO TABS
ORAL_TABLET | ORAL | Status: AC
  Filled 2020-02-09: qty 1

## 2020-02-09 MED ORDER — HEPARIN SODIUM (PORCINE) 5000 UNIT/ML IJ SOLN
INTRAMUSCULAR | Status: AC
  Filled 2020-02-09: qty 1

## 2020-02-09 MED ORDER — MORPHINE SULFATE (PF) 2 MG/ML IV SOLN: 2.0000 mg | INTRAVENOUS | Status: DC | PRN

## 2020-02-09 MED ORDER — ROCURONIUM BROMIDE 100 MG/10ML IV SOLN
INTRAVENOUS | Status: DC | PRN
  Administered 2020-02-09: 20 mg via INTRAVENOUS
  Administered 2020-02-09: 50 mg via INTRAVENOUS
  Administered 2020-02-09: 10 mg via INTRAVENOUS

## 2020-02-09 MED ORDER — DIPHENHYDRAMINE HCL 50 MG/ML IJ SOLN
12.5000 mg | Freq: Four times a day (QID) | INTRAMUSCULAR | Status: DC | PRN
Start: 2020-02-09 — End: 2020-02-11

## 2020-02-09 MED ORDER — ROCURONIUM BROMIDE 10 MG/ML (PF) SYRINGE
PREFILLED_SYRINGE | INTRAVENOUS | Status: AC
  Filled 2020-02-09: qty 10

## 2020-02-09 MED ORDER — LIDOCAINE HCL (CARDIAC) PF 100 MG/5ML IV SOSY
PREFILLED_SYRINGE | INTRAVENOUS | Status: DC | PRN
  Administered 2020-02-09: 60 mg via INTRAVENOUS
  Administered 2020-02-09: 100 mg via INTRAVENOUS
  Administered 2020-02-09: 40 mg via INTRAVENOUS

## 2020-02-09 MED ORDER — FENTANYL CITRATE (PF) 100 MCG/2ML IJ SOLN
INTRAMUSCULAR | Status: AC
  Filled 2020-02-09: qty 2

## 2020-02-09 MED ORDER — ONDANSETRON 4 MG PO TBDP: 4.0000 mg | ORAL_TABLET | Freq: Four times a day (QID) | ORAL | Status: DC | PRN

## 2020-02-09 MED ORDER — CHLORHEXIDINE GLUCONATE CLOTH 2 % EX PADS: 6.0000 | MEDICATED_PAD | Freq: Once | CUTANEOUS | Status: DC

## 2020-02-09 MED ORDER — CHLORHEXIDINE GLUCONATE 0.12 % MT SOLN
OROMUCOSAL | Status: AC
  Administered 2020-02-09: 15 mL via OROMUCOSAL
  Filled 2020-02-09: qty 15

## 2020-02-09 MED ORDER — KETAMINE HCL 10 MG/ML IJ SOLN
INTRAMUSCULAR | Status: DC | PRN
  Administered 2020-02-09 (×3): 10 mg via INTRAVENOUS
  Administered 2020-02-09: 20 mg via INTRAVENOUS

## 2020-02-09 MED ORDER — OXYCODONE HCL 5 MG PO TABS: 5.0000 mg | ORAL_TABLET | ORAL | Status: DC | PRN

## 2020-02-09 MED ORDER — ALVIMOPAN 12 MG PO CAPS
ORAL_CAPSULE | ORAL | Status: AC
  Administered 2020-02-09: 12 mg via ORAL
  Filled 2020-02-09: qty 1

## 2020-02-09 MED ORDER — GABAPENTIN 300 MG PO CAPS
ORAL_CAPSULE | ORAL | Status: AC
  Administered 2020-02-09: 300 mg via ORAL
  Filled 2020-02-09: qty 1

## 2020-02-09 MED ORDER — SERTRALINE HCL 100 MG PO TABS
100.0000 mg | ORAL_TABLET | Freq: Every day | ORAL | Status: DC
  Administered 2020-02-10 – 2020-02-11 (×2): 100 mg via ORAL
  Filled 2020-02-09 (×2): qty 1

## 2020-02-09 MED ORDER — FENTANYL CITRATE (PF) 100 MCG/2ML IJ SOLN: 25.0000 ug | INTRAMUSCULAR | Status: DC | PRN

## 2020-02-09 MED ORDER — CHLORHEXIDINE GLUCONATE 0.12 % MT SOLN: 15.0000 mL | Freq: Once | OROMUCOSAL | Status: AC

## 2020-02-09 MED ORDER — CELECOXIB 200 MG PO CAPS: 200.0000 mg | ORAL_CAPSULE | ORAL | Status: AC

## 2020-02-09 MED ORDER — ACETAMINOPHEN 500 MG PO TABS
ORAL_TABLET | ORAL | Status: AC
  Administered 2020-02-09: 1000 mg via ORAL
  Filled 2020-02-09: qty 2

## 2020-02-09 MED ORDER — LEVOTHYROXINE SODIUM 100 MCG PO TABS
100.0000 ug | ORAL_TABLET | Freq: Every day | ORAL | Status: DC
  Administered 2020-02-10 – 2020-02-11 (×2): 100 ug via ORAL
  Filled 2020-02-09 (×3): qty 1

## 2020-02-09 MED ORDER — METOPROLOL TARTRATE 5 MG/5ML IV SOLN: 5.0000 mg | Freq: Four times a day (QID) | INTRAVENOUS | Status: DC | PRN

## 2020-02-09 MED ORDER — ALVIMOPAN 12 MG PO CAPS: 12.0000 mg | ORAL_CAPSULE | ORAL | Status: AC

## 2020-02-09 MED ORDER — EPHEDRINE 5 MG/ML INJ
INTRAVENOUS | Status: AC
  Filled 2020-02-09: qty 10

## 2020-02-09 MED ORDER — GABAPENTIN 300 MG PO CAPS: 300.0000 mg | ORAL_CAPSULE | ORAL | Status: AC

## 2020-02-09 MED ORDER — ACETAMINOPHEN 500 MG PO TABS: 1000.0000 mg | ORAL_TABLET | ORAL | Status: AC

## 2020-02-09 MED ORDER — EPHEDRINE SULFATE 50 MG/ML IJ SOLN
INTRAMUSCULAR | Status: DC | PRN
  Administered 2020-02-09 (×2): 5 mg via INTRAVENOUS

## 2020-02-09 MED ORDER — BUPIVACAINE-EPINEPHRINE 0.25% -1:200000 IJ SOLN
INTRAMUSCULAR | Status: DC | PRN
  Administered 2020-02-09: 30 mL

## 2020-02-09 MED ORDER — SODIUM CHLORIDE 0.9 % IV SOLN
INTRAVENOUS | Status: DC | PRN
  Administered 2020-02-09: 70 mL

## 2020-02-09 MED ORDER — DEXAMETHASONE SODIUM PHOSPHATE 10 MG/ML IJ SOLN
INTRAMUSCULAR | Status: DC | PRN
  Administered 2020-02-09: 8 mg via INTRAVENOUS

## 2020-02-09 MED ORDER — DIPHENHYDRAMINE HCL 12.5 MG/5ML PO ELIX
12.5000 mg | ORAL_SOLUTION | Freq: Four times a day (QID) | ORAL | Status: DC | PRN
  Filled 2020-02-09: qty 5

## 2020-02-09 MED ORDER — ALBUMIN HUMAN 5 % IV SOLN
INTRAVENOUS | Status: AC
  Filled 2020-02-09: qty 250

## 2020-02-09 MED ORDER — PROCHLORPERAZINE MALEATE 5 MG PO TABS
10.0000 mg | ORAL_TABLET | Freq: Four times a day (QID) | ORAL | Status: DC | PRN
  Filled 2020-02-09: qty 1
  Filled 2020-02-09: qty 2

## 2020-02-09 MED ORDER — LIDOCAINE HCL (PF) 2 % IJ SOLN
INTRAMUSCULAR | Status: AC
  Filled 2020-02-09: qty 5

## 2020-02-09 MED ORDER — PHENYLEPHRINE HCL (PRESSORS) 10 MG/ML IV SOLN
INTRAVENOUS | Status: DC | PRN
  Administered 2020-02-09 (×7): 100 ug via INTRAVENOUS
  Administered 2020-02-09: 150 ug via INTRAVENOUS
  Administered 2020-02-09 (×2): 100 ug via INTRAVENOUS

## 2020-02-09 MED ORDER — SODIUM CHLORIDE 0.9 % IV SOLN: INTRAVENOUS | Status: DC

## 2020-02-09 MED ORDER — PROPOFOL 10 MG/ML IV BOLUS
INTRAVENOUS | Status: AC
  Filled 2020-02-09: qty 20

## 2020-02-09 MED ORDER — ACETAMINOPHEN 500 MG PO TABS: 1000.0000 mg | ORAL_TABLET | Freq: Four times a day (QID) | ORAL | Status: DC

## 2020-02-09 MED ORDER — ALBUMIN HUMAN 5 % IV SOLN: INTRAVENOUS | Status: DC | PRN

## 2020-02-09 MED ORDER — KETAMINE HCL 50 MG/5ML IJ SOSY
PREFILLED_SYRINGE | INTRAMUSCULAR | Status: AC
  Filled 2020-02-09: qty 5

## 2020-02-09 MED ORDER — FENTANYL CITRATE (PF) 100 MCG/2ML IJ SOLN
INTRAMUSCULAR | Status: DC | PRN
  Administered 2020-02-09 (×2): 25 ug via INTRAVENOUS

## 2020-02-09 MED ORDER — ONDANSETRON HCL 4 MG/2ML IJ SOLN: 4.0000 mg | Freq: Four times a day (QID) | INTRAMUSCULAR | Status: DC | PRN

## 2020-02-09 MED ORDER — BUPIVACAINE-EPINEPHRINE (PF) 0.25% -1:200000 IJ SOLN
INTRAMUSCULAR | Status: AC
  Filled 2020-02-09: qty 30

## 2020-02-09 MED ORDER — SODIUM CHLORIDE 0.9 % IV SOLN
INTRAVENOUS | Status: AC
  Filled 2020-02-09: qty 2

## 2020-02-09 MED ORDER — CARVEDILOL 3.125 MG PO TABS
3.1250 mg | ORAL_TABLET | Freq: Two times a day (BID) | ORAL | Status: DC
  Administered 2020-02-09 – 2020-02-11 (×4): 3.125 mg via ORAL
  Filled 2020-02-09 (×6): qty 1

## 2020-02-09 MED ORDER — LACTATED RINGERS IV SOLN: INTRAVENOUS | Status: DC

## 2020-02-09 MED ORDER — ONDANSETRON HCL 4 MG/2ML IJ SOLN
INTRAMUSCULAR | Status: AC
  Filled 2020-02-09: qty 2

## 2020-02-09 MED ORDER — PHENYLEPHRINE HCL (PRESSORS) 10 MG/ML IV SOLN
INTRAVENOUS | Status: AC
  Filled 2020-02-09: qty 1

## 2020-02-09 MED ORDER — ENOXAPARIN SODIUM 40 MG/0.4ML ~~LOC~~ SOLN: 40.0000 mg | SUBCUTANEOUS | Status: DC

## 2020-02-09 MED ORDER — CELECOXIB 200 MG PO CAPS
ORAL_CAPSULE | ORAL | Status: AC
  Administered 2020-02-09: 200 mg via ORAL
  Filled 2020-02-09: qty 1

## 2020-02-09 MED ORDER — BUPIVACAINE LIPOSOME 1.3 % IJ SUSP
INTRAMUSCULAR | Status: AC
  Filled 2020-02-09: qty 20

## 2020-02-09 MED ORDER — SODIUM CHLORIDE 0.9 % IV SOLN
2.0000 g | Freq: Three times a day (TID) | INTRAVENOUS | Status: AC
  Administered 2020-02-09 – 2020-02-10 (×2): 2 g via INTRAVENOUS
  Filled 2020-02-09 (×2): qty 2

## 2020-02-09 MED ORDER — SODIUM CHLORIDE 0.9 % IV SOLN
2.0000 g | INTRAVENOUS | Status: AC
  Administered 2020-02-09: 2 g via INTRAVENOUS

## 2020-02-09 MED ORDER — SODIUM CHLORIDE 0.9 % IV SOLN
INTRAVENOUS | Status: AC
  Administered 2020-02-09: 2 g via INTRAVENOUS
  Filled 2020-02-09: qty 2

## 2020-02-09 MED ORDER — LORAZEPAM 1 MG PO TABS: 1.0000 mg | ORAL_TABLET | Freq: Three times a day (TID) | ORAL | Status: DC | PRN

## 2020-02-09 MED ORDER — ORAL CARE MOUTH RINSE: 15.0000 mL | Freq: Once | OROMUCOSAL | Status: AC

## 2020-02-09 MED ORDER — SODIUM CHLORIDE (PF) 0.9 % IJ SOLN
INTRAMUSCULAR | Status: AC
  Filled 2020-02-09: qty 50

## 2020-02-09 MED ORDER — ALVIMOPAN 12 MG PO CAPS
12.0000 mg | ORAL_CAPSULE | ORAL | Status: AC
  Administered 2020-02-10: 12 mg via ORAL

## 2020-02-09 MED ORDER — ONDANSETRON HCL 4 MG/2ML IJ SOLN
INTRAMUSCULAR | Status: DC | PRN
  Administered 2020-02-09: 4 mg via INTRAVENOUS

## 2020-02-09 MED ORDER — HEPARIN SODIUM (PORCINE) 5000 UNIT/ML IJ SOLN
5000.0000 [IU] | Freq: Once | INTRAMUSCULAR | Status: AC
  Administered 2020-02-09: 5000 [IU] via SUBCUTANEOUS

## 2020-02-09 MED ORDER — PROPOFOL 10 MG/ML IV BOLUS
INTRAVENOUS | Status: DC | PRN
  Administered 2020-02-09: 100 mg via INTRAVENOUS

## 2020-02-09 MED ORDER — DEXAMETHASONE SODIUM PHOSPHATE 10 MG/ML IJ SOLN
INTRAMUSCULAR | Status: AC
  Filled 2020-02-09: qty 1

## 2020-02-09 MED ORDER — PROCHLORPERAZINE EDISYLATE 10 MG/2ML IJ SOLN
5.0000 mg | Freq: Four times a day (QID) | INTRAMUSCULAR | Status: DC | PRN
  Filled 2020-02-09: qty 2

## 2020-02-09 SURGICAL SUPPLY — 83 items
"PENCIL ELECTRO HAND CTR " (MISCELLANEOUS) ×2 IMPLANT
BAG LAPAROSCOPIC 12 15 PORT 16 (BASKET) IMPLANT
BAG RETRIEVAL 12/15 (BASKET)
CANISTER SUCT 1200ML W/VALVE (MISCELLANEOUS) ×2 IMPLANT
CANNULA REDUC XI 12-8 STAPL (CANNULA) ×1
CANNULA REDUCER 12-8 DVNC XI (CANNULA) ×2 IMPLANT
CHLORAPREP W/TINT 26 (MISCELLANEOUS) ×3 IMPLANT
CLIP VESOLOCK MED LG 6/CT (CLIP) ×3 IMPLANT
COVER TIP SHEARS 8 DVNC (MISCELLANEOUS) ×2 IMPLANT
COVER TIP SHEARS 8MM DA VINCI (MISCELLANEOUS) ×1
COVER WAND RF STERILE (DRAPES) ×3 IMPLANT
DECANTER SPIKE VIAL GLASS SM (MISCELLANEOUS) ×3 IMPLANT
DEFOGGER SCOPE WARMER CLEARIFY (MISCELLANEOUS) ×3 IMPLANT
DERMABOND ADVANCED (GAUZE/BANDAGES/DRESSINGS) ×1
DERMABOND ADVANCED .7 DNX12 (GAUZE/BANDAGES/DRESSINGS) ×2 IMPLANT
DRAPE 3/4 80X56 (DRAPES) ×2 IMPLANT
DRAPE ARM DVNC X/XI (DISPOSABLE) ×8 IMPLANT
DRAPE COLUMN DVNC XI (DISPOSABLE) ×2 IMPLANT
DRAPE DA VINCI XI ARM (DISPOSABLE) ×4
DRAPE DA VINCI XI COLUMN (DISPOSABLE) ×1
ELECT BLADE 6.5 EXT (BLADE) ×3 IMPLANT
ELECT CAUTERY BLADE 6.4 (BLADE) ×3 IMPLANT
ELECT REM PT RETURN 9FT ADLT (ELECTROSURGICAL) ×3
ELECTRODE REM PT RTRN 9FT ADLT (ELECTROSURGICAL) ×2 IMPLANT
GLOVE SURG ENC MOIS LTX SZ7 (GLOVE) ×14 IMPLANT
GOWN STRL REUS W/ TWL LRG LVL3 (GOWN DISPOSABLE) ×10 IMPLANT
GOWN STRL REUS W/TWL LRG LVL3 (GOWN DISPOSABLE) ×6
GRASPER LAPSCPC 5X45 DSP (INSTRUMENTS) ×3 IMPLANT
HANDLE YANKAUER SUCT BULB TIP (MISCELLANEOUS) ×3 IMPLANT
IRRIGATION STRYKERFLOW (MISCELLANEOUS) IMPLANT
IRRIGATOR STRYKERFLOW (MISCELLANEOUS) ×3
IV NS 1000ML (IV SOLUTION) ×1
IV NS 1000ML BAXH (IV SOLUTION) IMPLANT
KIT IMAGING PINPOINTPAQ (MISCELLANEOUS) ×3 IMPLANT
KIT PINK PAD W/HEAD ARE REST (MISCELLANEOUS) ×3
KIT PINK PAD W/HEAD ARM REST (MISCELLANEOUS) ×2 IMPLANT
LABEL OR SOLS (LABEL) ×3 IMPLANT
MANIFOLD NEPTUNE II (INSTRUMENTS) ×3 IMPLANT
NEEDLE HYPO 22GX1.5 SAFETY (NEEDLE) ×3 IMPLANT
NS IRRIG 500ML POUR BTL (IV SOLUTION) ×3 IMPLANT
OBTURATOR OPTICAL STANDARD 8MM (TROCAR) ×1
OBTURATOR OPTICAL STND 8 DVNC (TROCAR) ×2
OBTURATOR OPTICALSTD 8 DVNC (TROCAR) ×2 IMPLANT
PACK COLON CLEAN CLOSURE (MISCELLANEOUS) ×3 IMPLANT
PACK LAP CHOLECYSTECTOMY (MISCELLANEOUS) ×3 IMPLANT
PENCIL ELECTRO HAND CTR (MISCELLANEOUS) ×3 IMPLANT
PORT ACCESS TROCAR AIRSEAL 5 (TROCAR) ×3 IMPLANT
RELOAD STAPLE 60 2.5 WHT DVNC (STAPLE) ×2 IMPLANT
RELOAD STAPLE 60 3.5 BLU DVNC (STAPLE) ×6 IMPLANT
RELOAD STAPLER 2.5X60 WHT DVNC (STAPLE) IMPLANT
RELOAD STAPLER 3.5X60 BLU DVNC (STAPLE) ×6 IMPLANT
SEAL CANN UNIV 5-8 DVNC XI (MISCELLANEOUS) ×6 IMPLANT
SEAL XI 5MM-8MM UNIVERSAL (MISCELLANEOUS) ×3
SEALER VESSEL DA VINCI XI (MISCELLANEOUS) ×1
SEALER VESSEL EXT DVNC XI (MISCELLANEOUS) IMPLANT
SET TRI-LUMEN FLTR TB AIRSEAL (TUBING) ×3 IMPLANT
SOLUTION ELECTROLUBE (MISCELLANEOUS) ×3 IMPLANT
SPONGE LAP 18X18 RF (DISPOSABLE) ×6 IMPLANT
SPONGE LAP 4X18 RFD (DISPOSABLE) ×3 IMPLANT
STAPLER 60 DA VINCI SURE FORM (STAPLE) ×1
STAPLER 60 SUREFORM DVNC (STAPLE) ×2 IMPLANT
STAPLER CANNULA SEAL DVNC XI (STAPLE) ×2 IMPLANT
STAPLER CANNULA SEAL XI (STAPLE) ×1
STAPLER RELOAD 2.5X60 WHITE (STAPLE)
STAPLER RELOAD 2.5X60 WHT DVNC (STAPLE)
STAPLER RELOAD 3.5X60 BLU DVNC (STAPLE) ×6
STAPLER RELOAD 3.5X60 BLUE (STAPLE) ×3
SUT DVC VLOC 3-0 CL 6 P-12 (SUTURE) ×3 IMPLANT
SUT MNCRL AB 4-0 PS2 18 (SUTURE) ×3 IMPLANT
SUT PDS AB 0 CT1 27 (SUTURE) ×6 IMPLANT
SUT SILK 2 0 SH (SUTURE) ×6 IMPLANT
SUT V-LOC 90 ABS 3-0 VLT  V-20 (SUTURE) ×1
SUT V-LOC 90 ABS 3-0 VLT V-20 (SUTURE) ×2 IMPLANT
SUT VICRYL 0 AB UR-6 (SUTURE) IMPLANT
SUT VLOC 90 S/L VL9 GS22 (SUTURE) ×6 IMPLANT
SYR 20ML LL LF (SYRINGE) ×6 IMPLANT
SYS TROCAR 1.5-3 SLV ABD GEL (ENDOMECHANICALS) ×3
SYSTEM TROCR 1.5-3 SLV ABD GEL (ENDOMECHANICALS) ×2 IMPLANT
TAPE TRANSPORE STRL 2 31045 (GAUZE/BANDAGES/DRESSINGS) ×3 IMPLANT
TRAY FOLEY MTR SLVR 16FR STAT (SET/KITS/TRAYS/PACK) ×2 IMPLANT
TRAY FOLEY SLVR 16FR LF STAT (SET/KITS/TRAYS/PACK) ×3 IMPLANT
TROCAR ADV FIXATION 12X100MM (TROCAR) ×3 IMPLANT
TROCAR BALLN GELPORT 12X130M (ENDOMECHANICALS) IMPLANT

## 2020-02-09 NOTE — Interval H&P Note (Signed)
History and Physical Interval Note:  02/09/2020 7:23 AM  Cynthia Mcgrath  has presented today for surgery, with the diagnosis of colon cancer.  The various methods of treatment have been discussed with the patient and family. After consideration of risks, benefits and other options for treatment, the patient has consented to  Procedure(s): XI ROBOT ASSISTED RIGHT COLECTOMY w/Adrianne Allred, RNFA to assist (N/A) as a surgical intervention.  The patient's history has been reviewed, patient examined, no change in status, stable for surgery.  I have reviewed the patient's chart and labs.  Questions were answered to the patient's satisfaction.     [place]

## 2020-02-09 NOTE — Anesthesia Procedure Notes (Signed)
Procedure Name: Intubation Date/Time: 02/09/2020 7:49 AM Performed by: Lia Foyer, CRNA Pre-anesthesia Checklist: Patient identified, Emergency Drugs available, Suction available and Patient being monitored Patient Re-evaluated:Patient Re-evaluated prior to induction Oxygen Delivery Method: Circle system utilized Preoxygenation: Pre-oxygenation with 100% oxygen Induction Type: IV induction Ventilation: Mask ventilation without difficulty Laryngoscope Size: McGraph and 3 Grade View: Grade I Tube type: Oral Tube size: 7.0 mm Number of attempts: 1 Airway Equipment and Method: Stylet and Video-laryngoscopy Placement Confirmation: ETT inserted through vocal cords under direct vision,  positive ETCO2 and breath sounds checked- equal and bilateral Secured at: 19 cm Tube secured with: Tape Dental Injury: Teeth and Oropharynx as per pre-operative assessment

## 2020-02-09 NOTE — Progress Notes (Signed)
Pt's daughter, Vinnie Level, confirmed that pt completed bowel prep and stool was coming out mostly clear last night (02/08/20).

## 2020-02-09 NOTE — Op Note (Signed)
Robotic assisted laparoscopic Right colectomy w intracorporeal stapled anastomosis and ICG to check perfusion of Graft/Anastomosis  Pre-operative Diagnosis: Right colon adenoca  Post-operative Diagnosis: same  Procedure:  Robotic assisted laparoscopic Right colectomy  Surgeon: Caroleen Hamman, MD FACS  Anesthesia: Gen. with endotracheal tube  Findings: Tension Free anastomosis w/o intra op leak Good perfusion of anastomosis confirmed by ICG NO evidence of metastatic disease Mass cecum  1st Assist: Mrs. Allred RNFA ( required for exposure and creation of the anastomosis)  Estimated Blood Loss: 5cc       Specimens: Colon         Complications: none   Procedure Details  The patient was seen again in the Holding Room. The benefits, complications, treatment options, and expected outcomes were discussed with the patient. The risks of bleeding, infection, recurrence of symptoms, failure to resolve symptoms, anastomotic leak,bowel injury, any of which could require further surgery  were reviewed with the patient. The likelihood of improving the patient's symptoms with return to their baseline status is good.  The patient and/or family concurred with the proposed plan, giving informed consent.  The patient was taken to Operating Room, identified  and the procedure verified,  A Time Out was held and the above information confirmed.  Prior to the induction of general anesthesia, antibiotic prophylaxis was administered. VTE prophylaxis was in place. General endotracheal anesthesia was then administered and tolerated well. After the induction, the abdomen was prepped with Chloraprep and draped in the sterile fashion. The patient was positioned in the supine position.  Cut down technique was used to enter the abdominal cavity in the Left lower quadrant and a Mini gelport was placed after entyering the abdominal cavity and incising the fascia. Pneumoperitoneum was then created with CO2 and tolerated  well without any adverse changes in the patient's vital signs.  Three 8-mm ports were placed under direct vision. All skin incisions  were infiltrated with a local anesthetic agent before making the incision and placing the trocars. AN extra assist port was placed and we used Airseal system.  The patient was positioned  in Trendelenburg, robot was brought to the surgical field and docked in the standard fashion.  We made sure all the instrumentation was kept indirect view at all times and that there were no collision between the arms. I scrubbed out and went to the console. There was a tattoo proximal ascending colon and no evidence of metastatic disease. Attention then was turned to the right ileocolic pedicle. I  Was able to dissect it circumferentially and using vessel sealer I was able to divide the pedicle in the standard fashion and I did that 3 times. There was a hight ligation of the pedicle to obtain a good R0 resection with the nodal basin of the right colon.  Attention then was turned to the medial aspect of the mesentery.  We identified the the duodenal.  Then we develop the extraperitoneal plane until we reach the hepatic flexure.  At this point the vessel sealer was used to dissect the colon from a lateral to medial perspective as well as the terminal ileum. I turned my attention to the ileocecal valve and extended my mesenteric defect towards the terminal ileum.  72mm robotic stapler was used to divide the TI approximately 10 cm from the ileocecal valve.  We made sure that we use ICG before dividing any bowel structures. Our attention was turned to the transverse colon and to the right of the colic artery we created a  window  within the transverse colon.  We were also able to mobilize the greater omentum so we had good mobilityy.  Once we created a good window and the transverse colon was divided with a 60 mm stapler in the standard fashion.  The rest of the specimen was removed after dividing  the mesentery with vessel sealer. I was able to create an side-to-side functional end-to-end stapled anastomosis.    The common channel was closed using first layer of simple 3 o V-Loc suture followed by vertical conel suture. THere  was excellent perfusion of the anasdtomosis using ICG and there was a tension-free anastomosis.No evidence of any twisting.  Anastomosis was widely patent with good perfusion and tension free.  I removed all the needles under direct visualization.  At this point I removed the colon via the left lower quadrant  mino port after placing  The colon in a 15 mm specimen bag.  We undocked the robot and I scrubbed back in.  All the robotic ports were removed under direct visualization increase our incision in the left lower quadrant to allow the extraction of the specimen.  It was sent for permanent pathology.  At this point time I was able to use liposomal Marcaine to perform an abdominal block and inciision sites.  The anterior and posterior fascia were closed with elevated a running 0 PDS sutures in the standard fashion.  All the skin incisions were closed with4-0 monocryl and dermabond was applied  The patient was then extubated and brought to the recovery room in stable condition. Sponge, lap, and needle counts were correct at closure and at the conclusion of the case.               Caroleen Hamman, MD, FACS

## 2020-02-09 NOTE — Anesthesia Postprocedure Evaluation (Signed)
Anesthesia Post Note  Patient: Cynthia Mcgrath  Procedure(s) Performed: XI ROBOT ASSISTED RIGHT COLECTOMY w/Adrianne Allred, RNFA to assist (N/A ) INDOCYANINE GREEN FLUORESCENCE IMAGING (ICG)  Patient location during evaluation: PACU Anesthesia Type: General Level of consciousness: awake and alert Pain management: pain level controlled Vital Signs Assessment: post-procedure vital signs reviewed and stable Respiratory status: spontaneous breathing, nonlabored ventilation, respiratory function stable and patient connected to nasal cannula oxygen Cardiovascular status: blood pressure returned to baseline and stable Postop Assessment: no apparent nausea or vomiting Anesthetic complications: no   No complications documented.   Last Vitals:  Vitals:   02/09/20 1215 02/09/20 1230  BP: 138/67 129/62  Pulse: 76 72  Resp: 19 19  Temp:    SpO2: 100% 100%    Last Pain:  Vitals:   02/09/20 1230  TempSrc:   PainSc: [place]

## 2020-02-09 NOTE — Transfer of Care (Signed)
Immediate Anesthesia Transfer of Care Note  Patient: Cynthia Mcgrath  Procedure(s) Performed: XI ROBOT ASSISTED RIGHT COLECTOMY w/Adrianne Allred, RNFA to assist (N/A ) INDOCYANINE GREEN FLUORESCENCE IMAGING (ICG)  Patient Location: PACU  Anesthesia Type:General  Level of Consciousness: drowsy  Airway & Oxygen Therapy: Patient Spontanous Breathing and Patient connected to face mask oxygen  Post-op Assessment: Report given to RN and Post -op Vital signs reviewed and stable  Post vital signs: Reviewed and stable  Last Vitals:  Vitals Value Taken Time  BP 138/67 02/09/20 1215  Temp 36.3 C 02/09/20 1213  Pulse 72 02/09/20 1218  Resp 20 02/09/20 1218  SpO2 100 % 02/09/20 1218  Vitals shown include unvalidated device data.  Last Pain:  Vitals:   02/09/20 1213  TempSrc:   PainSc: Asleep         Complications: No complications documented.

## 2020-02-09 NOTE — Anesthesia Preprocedure Evaluation (Signed)
Anesthesia Evaluation  Patient identified by MRN, date of birth, ID band Patient awake    Reviewed: Allergy & Precautions, H&P , NPO status , Patient's Chart, lab work & pertinent test results  History of Anesthesia Complications Negative for: history of anesthetic complications  Airway Mallampati: III  TM Distance: >3 FB Neck ROM: limited    Dental  (+) Chipped   Pulmonary neg pulmonary ROS, neg shortness of breath,    Pulmonary exam normal        Cardiovascular Exercise Tolerance: Good hypertension, (-) angina(-) DOE Normal cardiovascular exam     Neuro/Psych PSYCHIATRIC DISORDERS negative neurological ROS  negative psych ROS   GI/Hepatic Neg liver ROS, GERD  Medicated and Controlled,  Endo/Other  Hypothyroidism   Renal/GU      Musculoskeletal   Abdominal   Peds  Hematology negative hematology ROS (+)   Anesthesia Other Findings Past Medical History: No date: Anxiety No date: Aortic atherosclerosis (HCC) No date: Brain tumor ([place]) No date: Breast fibrocystic disorder No date: Chronic systolic heart failure (HCC) No date: Colon cancer ([place]) No date: Depression No date: GERD (gastroesophageal reflux disease) No date: HTN (hypertension) No date: Hyperlipidemia No date: Hypothyroidism No date: IDA (iron deficiency anemia) No date: Idiopathic cardiomyopathy (HCC) No date: Meningioma (HCC)     Comment:  right acoustic No date: Osteoporosis, post-menopausal No date: 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 No date: Sigmoid diverticulosis No date: Valvular heart disease  Past Surgical History: 04/2004: ACOUSTIC MENINGIOMA RESECTION; Right No date: APPENDECTOMY 1980's: BREAST EXCISIONAL BIOPSY; Bilateral     Comment:  NEG No date: CATARACT EXTRACTION, BILATERAL; Bilateral No date: COLONOSCOPY 01/12/2020: COLONOSCOPY WITH PROPOFOL; N/A     Comment:  Procedure: COLONOSCOPY  WITH PROPOFOL;  Surgeon: Lucilla Lame, MD;  Location: [facility] ENDOSCOPY;  Service:               Endoscopy;  Laterality: N/A; 01/12/2020: ESOPHAGOGASTRODUODENOSCOPY (EGD) WITH PROPOFOL; N/A     Comment:  Procedure: ESOPHAGOGASTRODUODENOSCOPY (EGD) WITH               PROPOFOL;  Surgeon: Lucilla Lame, MD;  Location: [facility]               ENDOSCOPY;  Service: Endoscopy;  Laterality: N/A; No date: HEMORROIDECTOMY No date: IMPLANTATION VOCAL CORD; Right No date: removal of brain tumor No date: SHOULDER ARTHROSCOPY; Right     Comment:  rotator cuff repair 1985: THYROID GROWTH REMOVAL  BMI    Body Mass Index: 26.37 kg/m      Reproductive/Obstetrics negative OB ROS                             Anesthesia Physical Anesthesia Plan  ASA: III  Anesthesia Plan: General ETT   Post-op Pain Management:    Induction: Intravenous  PONV Risk Score and Plan: Ondansetron, Dexamethasone, Midazolam and Treatment may vary due to age or medical condition  Airway Management Planned: Oral ETT  Additional Equipment:   Intra-op Plan:   Post-operative Plan: Extubation in OR  Informed Consent: I have reviewed the patients History and Physical, chart, labs and discussed the procedure including the risks, benefits and alternatives for the proposed anesthesia with the patient or authorized representative who has indicated his/her understanding and acceptance.     Dental Advisory Given  Plan  Discussed with: Anesthesiologist, CRNA and Surgeon  Anesthesia Plan Comments: (Patient consented for risks of anesthesia including but not limited to:  - adverse reactions to medications - damage to eyes, teeth, lips or other oral mucosa - nerve damage due to positioning  - sore throat or hoarseness - Damage to heart, brain, nerves, lungs, other parts of body or loss of life  Patient voiced understanding.)        Anesthesia Quick Evaluation

## 2020-02-10 LAB — BASIC METABOLIC PANEL
Anion gap: 11 (ref 5–15)
BUN: 7 mg/dL — ABNORMAL LOW (ref 8–23)
CO2: 22 mmol/L (ref 22–32)
Calcium: 7.9 mg/dL — ABNORMAL LOW (ref 8.9–10.3)
Chloride: 108 mmol/L (ref 98–111)
Creatinine, Ser: 0.9 mg/dL (ref 0.44–1.00)
GFR, Estimated: >60 mL/min (ref >60)
Glucose, Bld: 93 mg/dL (ref 70–99)
Potassium: 3 mmol/L — ABNORMAL LOW (ref 3.5–5.1)
Sodium: 141 mmol/L (ref 135–145)

## 2020-02-10 LAB — CBC
HCT: 22.5 % — ABNORMAL LOW (ref 36.0–46.0)
Hemoglobin: 6.6 g/dL — ABNORMAL LOW (ref 12.0–15.0)
MCH: 21.9 pg — ABNORMAL LOW (ref 26.0–34.0)
MCHC: 29.3 g/dL — ABNORMAL LOW (ref 30.0–36.0)
MCV: 74.5 fL — ABNORMAL LOW (ref 80.0–100.0)
Platelets: 287 10*3/uL (ref 150–400)
RBC: 3.02 MIL/uL — ABNORMAL LOW (ref 3.87–5.11)
RDW: 20.6 % — ABNORMAL HIGH (ref 11.5–15.5)
WBC: 8.4 10*3/uL (ref 4.0–10.5)
nRBC: 0 % (ref 0.0–0.2)

## 2020-02-10 MED ORDER — ENOXAPARIN SODIUM 40 MG/0.4ML ~~LOC~~ SOLN
SUBCUTANEOUS | Status: AC
  Administered 2020-02-10: 40 mg via SUBCUTANEOUS
  Filled 2020-02-10: qty 0.4

## 2020-02-10 MED ORDER — POTASSIUM CHLORIDE CRYS ER 20 MEQ PO TBCR
EXTENDED_RELEASE_TABLET | ORAL | Status: AC
  Administered 2020-02-10: 20 meq via ORAL
  Filled 2020-02-10: qty 1

## 2020-02-10 MED ORDER — PREGABALIN 50 MG PO CAPS
ORAL_CAPSULE | ORAL | Status: AC
  Filled 2020-02-10: qty 1

## 2020-02-10 MED ORDER — FERROUS SULFATE 325 (65 FE) MG PO TABS
325.0000 mg | ORAL_TABLET | Freq: Three times a day (TID) | ORAL | Status: DC
  Administered 2020-02-10 – 2020-02-11 (×3): 325 mg via ORAL
  Filled 2020-02-10 (×6): qty 1

## 2020-02-10 MED ORDER — SODIUM CHLORIDE 0.9 % IV SOLN: 2.0000 g | Freq: Once | INTRAVENOUS | Status: DC

## 2020-02-10 MED ORDER — POTASSIUM CHLORIDE CRYS ER 20 MEQ PO TBCR
20.0000 meq | EXTENDED_RELEASE_TABLET | Freq: Two times a day (BID) | ORAL | Status: DC
  Administered 2020-02-10: 20 meq via ORAL

## 2020-02-10 MED ORDER — ACETAMINOPHEN 500 MG PO TABS
ORAL_TABLET | ORAL | Status: AC
  Administered 2020-02-10: 1000 mg via ORAL
  Filled 2020-02-10: qty 2

## 2020-02-10 MED ORDER — PREGABALIN 50 MG PO CAPS
50.0000 mg | ORAL_CAPSULE | Freq: Three times a day (TID) | ORAL | Status: DC
  Administered 2020-02-10: 50 mg via ORAL

## 2020-02-10 MED ORDER — PREGABALIN 50 MG PO CAPS
ORAL_CAPSULE | ORAL | Status: AC
  Administered 2020-02-10: 50 mg via ORAL
  Filled 2020-02-10: qty 1

## 2020-02-10 MED ORDER — ALVIMOPAN 12 MG PO CAPS
ORAL_CAPSULE | ORAL | Status: AC
  Filled 2020-02-10: qty 1

## 2020-02-10 MED ORDER — POTASSIUM CHLORIDE CRYS ER 20 MEQ PO TBCR
EXTENDED_RELEASE_TABLET | ORAL | Status: AC
  Filled 2020-02-10: qty 1

## 2020-02-10 NOTE — Evaluation (Signed)
Physical Therapy Evaluation Patient Details Name: Cynthia Mcgrath MRN: [id number] DOB: [birth year] Today's Date: 02/10/2020   History of Present Illness  admitted for acute hospitalization s/p R colectomy (02/09/20) secondary to colon cancer.  Clinical Impression  Upon evaluation, patient alert and oriented, follows commands and eager for mobility efforts this PM.  Just completed toileting (indep) in room upon arrival to session.  Rates pain in abdomen grossly 6/10, but does not impact mobility tasks.  Bilat UE/LE strength and ROM grossly symmetrical and WFL; no focal weakness appreciated.  Able to complete bed mobility with mod indep; sit/stand, basic transfers and gait (250') without assist device, mod indep.   Demonstrates reciprocal stepping pattern with good step height/length, fair/good cadence; completes dynamic gait components without buckling or LOB. Confident in gait performance. Mild SOB, but sats >93% on RA throughout Appears to be at baseline level of functional ability without acute PT need identified.  Do encouraged continued participation with progressive mobilization (3x/day) throughout remaining stay; will engage with mobility specialist for continued mobility efforts.    Follow Up Recommendations No PT follow up    Equipment Recommendations       Recommendations for Other Services       Precautions / Restrictions Precautions Precautions: None Restrictions Weight Bearing Restrictions: No      Mobility  Bed Mobility Overal bed mobility: Independent                  Transfers Overall transfer level: Modified independent Equipment used: None                Ambulation/Gait Ambulation/Gait assistance: Modified independent (Device/Increase time) Gait Distance (Feet): 250 Feet Assistive device: None       General Gait Details: reciprocal stepping pattern with good step height/length, fair/good cadence; completes dynamic gait components without buckling  or LOB. Confident in gait performance. Mild SOB, but sats >93% on RA throughout  Stairs            Wheelchair Mobility    Modified Rankin (Stroke Patients Only)       Balance Overall balance assessment: Needs assistance Sitting-balance support: No upper extremity supported;Feet supported Sitting balance-Leahy Scale: Good     Standing balance support: No upper extremity supported Standing balance-Leahy Scale: Good Standing balance comment: functional reach >6" outside immediate BOS                             Pertinent Vitals/Pain Pain Assessment: 0-10 Pain Score: 6  Pain Location: abdomen Pain Descriptors / Indicators: Sore Pain Intervention(s): Limited activity within patient's tolerance;Monitored during session;Repositioned    Home Living Family/patient expects to be discharged to:: Private residence Living Arrangements: Alone Available Help at Discharge: Family;Available PRN/intermittently Type of Home: House Home Access: Stairs to enter   Entrance Stairs-Number of Steps: 2-3 Home Layout: One level;Laundry or work area in basement;Able to live on main level with bedroom/bathroom (daughter assists with laundry in basement as needed) Home Equipment: None      Prior Function Level of Independence: Independent         Comments: Indep with ADLs, household and community mobilization without assist device; no home O2; denies fall history.     Hand Dominance        Extremity/Trunk Assessment   Upper Extremity Assessment Upper Extremity Assessment: Overall WFL for tasks assessed    Lower Extremity Assessment Lower Extremity Assessment: Overall WFL for tasks assessed (grossly 4+ to 5/5  throughout)       Communication   Communication: No difficulties  Cognition Arousal/Alertness: Awake/alert Behavior During Therapy: WFL for tasks assessed/performed Overall Cognitive Status: Within Functional Limits for tasks assessed                                         General Comments      Exercises     Assessment/Plan    PT Assessment Patent does not need any further PT services  PT Problem List Decreased activity tolerance;Decreased mobility       PT Treatment Interventions Gait training;Functional mobility training;Therapeutic activities;Patient/family education    PT Goals (Current goals can be found in the Care Plan section)  Acute Rehab PT Goals Patient Stated Goal: to go for a walk PT Goal Formulation: With patient Time For Goal Achievement: 02/10/20 Potential to Achieve Goals: Good    Frequency     Barriers to discharge        Co-evaluation               AM-PAC PT "6 Clicks" Mobility  Outcome Measure Help needed turning from your back to your side while in a flat bed without using bedrails?: None Help needed moving from lying on your back to sitting on the side of a flat bed without using bedrails?: None Help needed moving to and from a bed to a chair (including a wheelchair)?: None Help needed standing up from a chair using your arms (e.g., wheelchair or bedside chair)?: None Help needed to walk in hospital room?: None Help needed climbing 3-5 steps with a railing? : None 6 Click Score: 24    End of Session Equipment Utilized During Treatment: Gait belt Activity Tolerance: Patient tolerated treatment well Patient left: in bed;with call bell/phone within reach Nurse Communication: Mobility status PT Visit Diagnosis: Muscle weakness (generalized) (M62.81);Difficulty in walking, not elsewhere classified (R26.2)    Time: 1219-7588 PT Time Calculation (min) (ACUTE ONLY): 21 min   Charges:   PT Evaluation $PT Eval Low Complexity: 1 Low         Desarae Placide H. Owens Shark, PT, DPT, NCS 02/10/20, 3:38 PM [phone]

## 2020-02-10 NOTE — Progress Notes (Signed)
PT Cancellation Note  Patient Details Name: Cynthia Mcgrath MRN: [id number] DOB: 02/01/1940   Cancelled Treatment:    Reason Eval/Treat Not Completed:  (Consult received and chart reviewed. Patient currently eating lunch.  Will re-attempt at later time as medically appropriate and available.)   Shalini Mair H. Owens Shark, PT, DPT, NCS 02/10/20, 1:10 PM [phone]

## 2020-02-10 NOTE — Progress Notes (Signed)
[place] Hospital Day(s): 1.   Post op day(s): 1 Day Post-Op.   Interval History:  Patient seen and examined No acute events or new complaints overnight.  Patient reports she is feeling good; some incisional pain; pain medications are helping She is without fever, chills, nausea, emesis She is without leukocytosis; WBC 8.4K Hgb 6.6 (chronic anemia); she is without symptoms Renal function normal, sCr - 0.90; UO - 1.2L Hypokalemia to 3.0 She has tolerated full liquids Awaiting bowel function return Has not ambulated post-op  Vital signs in last 24 hours: [min-max] current  Temp:  [97 F (36.1 C)-99.1 F (37.3 C)] 99.1 F (37.3 C) (02/09 0405) Pulse Rate:  [68-76] 76 (02/09 0405) Resp:  [14-23] 18 (02/09 0405) BP: (110-139)/(58-71) 127/58 (02/09 0405) SpO2:  [86 %-100 %] 97 % (02/09 0405)     Height: 5' (152.4 cm) Weight: 61.2 kg BMI (Calculated): 26.37   Intake/Output last 2 shifts:  02/08 0701 - 02/09 0700 In: 3335.8 [P.O.:600; I.V.:2185.8; IV Piggyback:550] Out: 1220 [Urine:1195; Blood:25]   Physical Exam:  Constitutional: alert, cooperative and no distress  Respiratory: breathing non-labored at rest, weaning from [place] Cardiovascular: regular rate and sinus rhythm  Gastrointestinal: Soft, incisional soreness, non-distended, no rebound/guarding Integumentary: Laparoscopic incisions are CDI with dermabond, no erythema or drainage   Labs:  CBC Latest Ref Rng & Units 02/10/2020 02/09/2020 02/02/2020  WBC 4.0 - 10.5 K/uL 8.4 11.3(H) 8.3  Hemoglobin 12.0 - 15.0 g/dL 6.6(L) 7.2(L) 8.1(L)  Hematocrit 36.0 - 46.0 % 22.5(L) 24.7(L) 27.8(L)  Platelets 150 - 400 K/uL 287 293 369   CMP Latest Ref Rng & Units 02/10/2020 02/09/2020 01/21/2020  Glucose 70 - 99 mg/dL 93 - 103(H)  BUN 8 - 23 mg/dL 7(L) - 13  Creatinine 0.44 - 1.00 mg/dL 0.90 0.89 0.82  Sodium 135 - 145 mmol/L 141 - 144  Potassium 3.5 - 5.1 mmol/L 3.0(L) - 3.6  Chloride 98 - 111  mmol/L 108 - 106  CO2 22 - 32 mmol/L 22 - 26  Calcium 8.9 - 10.3 mg/dL 7.9(L) - 8.6(L)  Total Protein 6.5 - 8.1 g/dL - - 6.5  Total Bilirubin 0.3 - 1.2 mg/dL - - 0.6  Alkaline Phos 38 - 126 U/L - - 57  AST 15 - 41 U/L - - 20  ALT 0 - 44 U/L - - 11    Imaging studies: No new pertinent imaging studies   Assessment/Plan:  80 y.o. 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   - Continue full liquids for now; ADAT  - Will discontinue IVF  - Discontinue foley catheter  - Monitor H&H; she is chronically anemia and I suspect this is dilutional in nature. She is without symptoms currently. We will hold off on transfusion for now,; however, should she become symptomatic or anemia worsen, then we will reconsider  - Monitor abdominal examination; on-going bowel function  - Pain control prn; antiemetics prn  - Replete K+; monitor   - Mobilization encouraged; engage PT   - Wean to RA    All of the above findings and recommendations were discussed with the patient, patient's family (Daughter at bedside), and the medical team, and all of patient's and family's questions were answered to their expressed satisfaction.  -- Edison Simon, PA-C [facility] 02/10/2020, 7:44 AM [phone] M-F: 7am - 4pm

## 2020-02-11 LAB — BASIC METABOLIC PANEL
Anion gap: 8 (ref 5–15)
BUN: 6 mg/dL — ABNORMAL LOW (ref 8–23)
CO2: 25 mmol/L (ref 22–32)
Calcium: 8.1 mg/dL — ABNORMAL LOW (ref 8.9–10.3)
Chloride: 110 mmol/L (ref 98–111)
Creatinine, Ser: 0.63 mg/dL (ref 0.44–1.00)
GFR, Estimated: >60 mL/min (ref >60)
Glucose, Bld: 92 mg/dL (ref 70–99)
Potassium: 3.5 mmol/L (ref 3.5–5.1)
Sodium: 143 mmol/L (ref 135–145)

## 2020-02-11 LAB — CBC
HCT: 22.3 % — ABNORMAL LOW (ref 36.0–46.0)
Hemoglobin: 6.7 g/dL — ABNORMAL LOW (ref 12.0–15.0)
MCH: 22.3 pg — ABNORMAL LOW (ref 26.0–34.0)
MCHC: 30 g/dL (ref 30.0–36.0)
MCV: 74.3 fL — ABNORMAL LOW (ref 80.0–100.0)
Platelets: 294 10*3/uL (ref 150–400)
RBC: 3 MIL/uL — ABNORMAL LOW (ref 3.87–5.11)
RDW: 21 % — ABNORMAL HIGH (ref 11.5–15.5)
WBC: 6.4 10*3/uL (ref 4.0–10.5)
nRBC: 0 % (ref 0.0–0.2)

## 2020-02-11 MED ORDER — FUROSEMIDE 10 MG/ML IJ SOLN
INTRAMUSCULAR | Status: AC
  Filled 2020-02-11: qty 2

## 2020-02-11 MED ORDER — FUROSEMIDE 10 MG/ML IJ SOLN
20.0000 mg | Freq: Once | INTRAMUSCULAR | Status: AC
  Administered 2020-02-11: 20 mg via INTRAVENOUS

## 2020-02-11 MED ORDER — IBUPROFEN 600 MG PO TABS: 600.0000 mg | ORAL_TABLET | Freq: Four times a day (QID) | ORAL | 0 refills | Status: DC | PRN

## 2020-02-11 MED ORDER — POTASSIUM CHLORIDE CRYS ER 20 MEQ PO TBCR
EXTENDED_RELEASE_TABLET | ORAL | Status: AC
  Administered 2020-02-11: 20 meq via ORAL
  Filled 2020-02-11: qty 1

## 2020-02-11 MED ORDER — ACETAMINOPHEN 500 MG PO TABS
ORAL_TABLET | ORAL | Status: AC
  Administered 2020-02-11: 1000 mg via ORAL
  Filled 2020-02-11: qty 2

## 2020-02-11 MED ORDER — ENOXAPARIN SODIUM 40 MG/0.4ML ~~LOC~~ SOLN
SUBCUTANEOUS | Status: AC
  Filled 2020-02-11: qty 0.4

## 2020-02-11 MED ORDER — LOPERAMIDE HCL 2 MG PO TABS: 2.0000 mg | ORAL_TABLET | Freq: Four times a day (QID) | ORAL | 0 refills | Status: AC | PRN

## 2020-02-11 MED ORDER — OXYCODONE HCL 5 MG PO TABS: 5.0000 mg | ORAL_TABLET | Freq: Four times a day (QID) | ORAL | 0 refills | Status: DC | PRN

## 2020-02-11 MED ORDER — PREGABALIN 50 MG PO CAPS
ORAL_CAPSULE | ORAL | Status: AC
  Administered 2020-02-11: 50 mg via ORAL
  Filled 2020-02-11: qty 1

## 2020-02-11 NOTE — Discharge Summary (Signed)
[facility] SURGICAL DISCHARGE SUMMARY  Patient ID: Cynthia Mcgrath MRN: [id number] DOB/AGE: 81-Nov-1942 81 y.o.  Admit date: 02/09/2020 Discharge date: 02/11/2020  Discharge Diagnoses Patient Active Problem List   Diagnosis Date Noted  . S/P laparoscopic colectomy 02/09/2020    Consultants None  Procedures 02/09/2020: Robotic assisted right colectomy   HPI: Cynthia Mcgrath is a 80 y.o. female with a history of adenocarcinoma of the cecum who presents to [facility] on 02/08 for scheduled robotic assisted laparoscopic right colectomy with Dr Dahlia Byes.   Hospital Course: Informed consent was obtained and documented, and patient underwent uneventful robotic assisted laparoscopic right colectomy (Dr Dahlia Byes, 02/09/2020).  Post-operatively, patient did very well. She had dilutional anemia, slightly worse from her baseline, but remained asymptomatic and did not require transfusion. Advancement of patient's diet and ambulation with PT were well-tolerated. The remainder of patient's hospital course was essentially unremarkable, and discharge planning was initiated accordingly with patient safely able to be discharged home with appropriate discharge instructions, pain control, and outpatient follow-up after all of her questions were answered to her expressed satisfaction.   Discharge Condition: Good   Physical Examination:  Constitutional: alert, cooperative and no distress  Respiratory: breathing non-labored at rest Cardiovascular: regular rate and sinus rhythm  Gastrointestinal: Soft, incisional soreness, non-distended, no rebound/guarding Integumentary: Laparoscopic incisions are CDI with dermabond, no erythema or drainage    Allergies as of 02/11/2020      Reactions   Codeine Itching      Medication List    STOP taking these medications   bisacodyl 5 MG EC tablet Commonly known as: DULCOLAX   metroNIDAZOLE 500 MG tablet Commonly known as: FLAGYL   neomycin 500 MG  tablet Commonly known as: MYCIFRADIN   polyethylene glycol powder 17 GM/SCOOP powder Commonly known as: MiraLax     TAKE these medications   carvedilol 3.125 MG tablet Commonly known as: COREG Take 3.125 mg by mouth 2 (two) times daily with a meal.   furosemide 20 MG tablet Commonly known as: LASIX Take 20 mg by mouth daily as needed for edema or fluid.   ibuprofen 600 MG tablet Commonly known as: ADVIL Take 1 tablet (600 mg total) by mouth every 6 (six) hours as needed.   levothyroxine 100 MCG tablet Commonly known as: SYNTHROID Take 100 mcg by mouth daily before breakfast.   loperamide 2 MG tablet Commonly known as: Imodium A-D Take 1-2 tablets (2-4 mg total) by mouth 4 (four) times daily as needed for diarrhea or loose stools.   LORazepam 1 MG tablet Commonly known as: ATIVAN Take 1 mg by mouth 3 (three) times daily as needed for anxiety.   losartan 25 MG tablet Commonly known as: COZAAR Take 25 mg by mouth daily.   omeprazole 20 MG capsule Commonly known as: PRILOSEC Take 20 mg by mouth daily.   oxyCODONE 5 MG immediate release tablet Commonly known as: Oxy IR/ROXICODONE Take 1 tablet (5 mg total) by mouth every 6 (six) hours as needed for severe pain or breakthrough pain.   sertraline 100 MG tablet Commonly known as: ZOLOFT Take 100 mg by mouth daily.         Follow-up Information    Pabon, [place] F, MD. Schedule an appointment as soon as possible for a visit in 2 week(s).   Specialty: General Surgery Why: s/p laparoscopic right colectomy  Contact information: [address] [place] [address] [phone]  Time spent on discharge management including discussion of hospital course, clinical condition, outpatient instructions, prescriptions, and follow up with the patient and members of the medical team: >30 minutes  -- Edison Simon , PA-C [facility]  02/11/2020, 9:00  AM [phone] M-F: 7am - 4pm

## 2020-02-11 NOTE — Plan of Care (Signed)
Patient exhibited adequate knowledge to care for self when she gets home and her daughter will be staying with her to make sure she will follow discharge instructions.

## 2020-02-11 NOTE — Discharge Instructions (Signed)
In addition to included general post-operative instructions for laparoscopic right colectomy,  Diet: Gradually resume regular diet as tolerated at home.  Activity: No heavy lifting >20 pounds (children, pets, laundry, garbage) for 4-6 weeks, but light activity and walking are encouraged. Do not drive or drink alcohol if taking narcotic pain medications or having pain that might distract from driving.  Wound care: 2 days after surgery (02/10), you may shower/get incision wet with soapy water and pat dry (do not rub incisions), but no baths or submerging incision underwater until follow-up.   Medications: Resume all home medications. For mild to moderate pain: acetaminophen (Tylenol) or ibuprofen/naproxen (if no kidney disease). Combining Tylenol with alcohol can substantially increase your risk of causing liver disease. Narcotic pain medications, if prescribed, can be used for severe pain, though may cause nausea, constipation, and drowsiness. Do not combine Tylenol and Percocet (or similar) within a 6 hour period as Percocet (and similar) contain(s) Tylenol. If you do not need the narcotic pain medication, you do not need to fill the prescription.  Diarrhea: You can use Imodium, which is obtainable over the counter, for diarrhea.   Call office [phone] / [phone]) at any time if any questions, worsening pain, fevers/chills, bleeding, drainage from incision site, or other concerns.

## 2020-02-12 ENCOUNTER — Telehealth: Payer: Self-pay | Admitting: Surgery

## 2020-02-12 NOTE — Telephone Encounter (Signed)
Path noted and d/w pt and daughter in detail. She is doing very well and they are very appreciative

## 2020-02-29 ENCOUNTER — Telehealth: Payer: Self-pay

## 2020-02-29 ENCOUNTER — Encounter: Payer: Self-pay | Admitting: Surgery

## 2020-02-29 ENCOUNTER — Ambulatory Visit (INDEPENDENT_AMBULATORY_CARE_PROVIDER_SITE_OTHER): Payer: Medicare HMO | Admitting: Surgery

## 2020-02-29 ENCOUNTER — Other Ambulatory Visit: Payer: Self-pay

## 2020-02-29 ENCOUNTER — Other Ambulatory Visit
Admission: RE | Admit: 2020-02-29 | Discharge: 2020-02-29 | Disposition: A | Discharge status: Home / Self Care | Payer: Medicare HMO | Attending: Surgery | Admitting: Surgery

## 2020-02-29 VITALS — BP 132/80 | HR 103 | Temp 98.4°F | Ht 60.0 in | Wt 136.8 lb

## 2020-02-29 DIAGNOSIS — D649 Anemia, unspecified: Secondary | ICD-10-CM | POA: Insufficient documentation

## 2020-02-29 LAB — CBC WITH DIFFERENTIAL/PLATELET
Abs Immature Granulocytes: 0.02 10*3/uL (ref 0.00–0.07)
Basophils Absolute: 0 10*3/uL (ref 0.0–0.1)
Basophils Relative: 1 %
Eosinophils Absolute: 0.4 10*3/uL (ref 0.0–0.5)
Eosinophils Relative: 9 %
HCT: 31.2 % — ABNORMAL LOW (ref 36.0–46.0)
Hemoglobin: 9.1 g/dL — ABNORMAL LOW (ref 12.0–15.0)
Immature Granulocytes: 0 %
Lymphocytes Relative: 32 %
Lymphs Abs: 1.5 10*3/uL (ref 0.7–4.0)
MCH: 23.9 pg — ABNORMAL LOW (ref 26.0–34.0)
MCHC: 29.2 g/dL — ABNORMAL LOW (ref 30.0–36.0)
MCV: 82.1 fL (ref 80.0–100.0)
Monocytes Absolute: 0.5 10*3/uL (ref 0.1–1.0)
Monocytes Relative: 11 %
Neutro Abs: 2.2 10*3/uL (ref 1.7–7.7)
Neutrophils Relative %: 47 %
Platelets: 442 10*3/uL — ABNORMAL HIGH (ref 150–400)
RBC: 3.8 MIL/uL — ABNORMAL LOW (ref 3.87–5.11)
RDW: 26.7 % — ABNORMAL HIGH (ref 11.5–15.5)
Smear Review: NORMAL
WBC: 4.7 10*3/uL (ref 4.0–10.5)
nRBC: 0 % (ref 0.0–0.2)

## 2020-02-29 LAB — COMPREHENSIVE METABOLIC PANEL
ALT: 9 U/L (ref 0–44)
AST: 19 U/L (ref 15–41)
Albumin: 3.3 g/dL — ABNORMAL LOW (ref 3.5–5.0)
Alkaline Phosphatase: 66 U/L (ref 38–126)
Anion gap: 10 (ref 5–15)
BUN: 8 mg/dL (ref 8–23)
CO2: 28 mmol/L (ref 22–32)
Calcium: 8.5 mg/dL — ABNORMAL LOW (ref 8.9–10.3)
Chloride: 104 mmol/L (ref 98–111)
Creatinine, Ser: 0.85 mg/dL (ref 0.44–1.00)
GFR, Estimated: >60 mL/min (ref >60)
Glucose, Bld: 107 mg/dL — ABNORMAL HIGH (ref 70–99)
Potassium: 3 mmol/L — ABNORMAL LOW (ref 3.5–5.1)
Sodium: 142 mmol/L (ref 135–145)
Total Bilirubin: 0.5 mg/dL (ref 0.3–1.2)
Total Protein: 6.7 g/dL (ref 6.5–8.1)

## 2020-02-29 NOTE — Patient Instructions (Signed)
Please go directly to the lab today.   Referral placed to [place] office. Someone from their office will call to schedule an appointment. If you do not hear from anyone within 5 days please call the office to let us know so we can check on this for you.   Please call with any questions or concerns.

## 2020-02-29 NOTE — Telephone Encounter (Signed)
Patient notified of lab work. Hemoglobin .

## 2020-03-01 DIAGNOSIS — I38 Endocarditis, valve unspecified: Secondary | ICD-10-CM | POA: Diagnosis not present

## 2020-03-01 DIAGNOSIS — I5022 Chronic systolic (congestive) heart failure: Secondary | ICD-10-CM | POA: Diagnosis not present

## 2020-03-01 DIAGNOSIS — E7800 Pure hypercholesterolemia, unspecified: Secondary | ICD-10-CM | POA: Diagnosis not present

## 2020-03-01 DIAGNOSIS — I428 Other cardiomyopathies: Secondary | ICD-10-CM | POA: Diagnosis not present

## 2020-03-01 DIAGNOSIS — D649 Anemia, unspecified: Secondary | ICD-10-CM | POA: Diagnosis not present

## 2020-03-02 NOTE — Progress Notes (Signed)
S/p rob rigth colectomy Path d/w pt in detail, adenoca 24 nodes w/o mets No issues, taking po  PE NAD Abd: soft , nt  . Incisions c/d/i, no infection  A./P doing well Anemia  And colon ca will make referral to oncology to address persistent anemia.  recheck Hb I do not suspect chemo will be beneficial but will defer to oncology

## 2020-03-03 ENCOUNTER — Other Ambulatory Visit: Payer: Self-pay | Admitting: Pathology

## 2020-03-03 ENCOUNTER — Encounter: Payer: Self-pay | Admitting: Oncology

## 2020-03-03 LAB — SURGICAL PATHOLOGY

## 2020-03-09 ENCOUNTER — Inpatient Hospital Stay: Payer: Medicare HMO

## 2020-03-09 ENCOUNTER — Other Ambulatory Visit: Payer: Self-pay

## 2020-03-09 ENCOUNTER — Inpatient Hospital Stay: Payer: Medicare HMO | Attending: Oncology | Admitting: Oncology

## 2020-03-09 ENCOUNTER — Encounter: Payer: Self-pay | Admitting: Oncology

## 2020-03-09 VITALS — BP 119/76 | HR 87 | Temp 99.1°F | Resp 18 | Ht 60.0 in | Wt 137.8 lb

## 2020-03-09 DIAGNOSIS — Z801 Family history of malignant neoplasm of trachea, bronchus and lung: Secondary | ICD-10-CM | POA: Diagnosis not present

## 2020-03-09 DIAGNOSIS — Z808 Family history of malignant neoplasm of other organs or systems: Secondary | ICD-10-CM | POA: Insufficient documentation

## 2020-03-09 DIAGNOSIS — K449 Diaphragmatic hernia without obstruction or gangrene: Secondary | ICD-10-CM | POA: Insufficient documentation

## 2020-03-09 DIAGNOSIS — D509 Iron deficiency anemia, unspecified: Secondary | ICD-10-CM | POA: Insufficient documentation

## 2020-03-09 DIAGNOSIS — Z803 Family history of malignant neoplasm of breast: Secondary | ICD-10-CM | POA: Diagnosis not present

## 2020-03-09 DIAGNOSIS — Z79899 Other long term (current) drug therapy: Secondary | ICD-10-CM | POA: Diagnosis not present

## 2020-03-09 DIAGNOSIS — C182 Malignant neoplasm of ascending colon: Secondary | ICD-10-CM | POA: Insufficient documentation

## 2020-03-09 DIAGNOSIS — I700 Atherosclerosis of aorta: Secondary | ICD-10-CM | POA: Insufficient documentation

## 2020-03-09 DIAGNOSIS — E876 Hypokalemia: Secondary | ICD-10-CM | POA: Insufficient documentation

## 2020-03-09 DIAGNOSIS — Z885 Allergy status to narcotic agent status: Secondary | ICD-10-CM | POA: Diagnosis not present

## 2020-03-09 DIAGNOSIS — K317 Polyp of stomach and duodenum: Secondary | ICD-10-CM | POA: Insufficient documentation

## 2020-03-09 DIAGNOSIS — J479 Bronchiectasis, uncomplicated: Secondary | ICD-10-CM | POA: Insufficient documentation

## 2020-03-09 DIAGNOSIS — Z8379 Family history of other diseases of the digestive system: Secondary | ICD-10-CM | POA: Insufficient documentation

## 2020-03-09 DIAGNOSIS — D649 Anemia, unspecified: Secondary | ICD-10-CM

## 2020-03-09 DIAGNOSIS — Z9049 Acquired absence of other specified parts of digestive tract: Secondary | ICD-10-CM | POA: Insufficient documentation

## 2020-03-09 DIAGNOSIS — K5900 Constipation, unspecified: Secondary | ICD-10-CM | POA: Diagnosis not present

## 2020-03-09 DIAGNOSIS — D500 Iron deficiency anemia secondary to blood loss (chronic): Secondary | ICD-10-CM

## 2020-03-09 DIAGNOSIS — Z7189 Other specified counseling: Secondary | ICD-10-CM

## 2020-03-09 LAB — CBC WITH DIFFERENTIAL/PLATELET
Abs Immature Granulocytes: 0.01 10*3/uL (ref 0.00–0.07)
Basophils Absolute: 0 10*3/uL (ref 0.0–0.1)
Basophils Relative: 1 %
Eosinophils Absolute: 0.4 10*3/uL (ref 0.0–0.5)
Eosinophils Relative: 10 %
HCT: 32.7 % — ABNORMAL LOW (ref 36.0–46.0)
Hemoglobin: 9.8 g/dL — ABNORMAL LOW (ref 12.0–15.0)
Immature Granulocytes: 0 %
Lymphocytes Relative: 42 %
Lymphs Abs: 1.6 10*3/uL (ref 0.7–4.0)
MCH: 25 pg — ABNORMAL LOW (ref 26.0–34.0)
MCHC: 30 g/dL (ref 30.0–36.0)
MCV: 83.4 fL (ref 80.0–100.0)
Monocytes Absolute: 0.5 10*3/uL (ref 0.1–1.0)
Monocytes Relative: 12 %
Neutro Abs: 1.4 10*3/uL — ABNORMAL LOW (ref 1.7–7.7)
Neutrophils Relative %: 35 %
Platelets: 285 10*3/uL (ref 150–400)
RBC: 3.92 MIL/uL (ref 3.87–5.11)
RDW: 25.4 % — ABNORMAL HIGH (ref 11.5–15.5)
WBC: 4 10*3/uL (ref 4.0–10.5)
nRBC: 0 % (ref 0.0–0.2)

## 2020-03-09 LAB — COMPREHENSIVE METABOLIC PANEL
ALT: 12 U/L (ref 0–44)
AST: 23 U/L (ref 15–41)
Albumin: 3.5 g/dL (ref 3.5–5.0)
Alkaline Phosphatase: 57 U/L (ref 38–126)
Anion gap: 10 (ref 5–15)
BUN: 7 mg/dL — ABNORMAL LOW (ref 8–23)
CO2: 29 mmol/L (ref 22–32)
Calcium: 8.6 mg/dL — ABNORMAL LOW (ref 8.9–10.3)
Chloride: 104 mmol/L (ref 98–111)
Creatinine, Ser: 0.85 mg/dL (ref 0.44–1.00)
GFR, Estimated: >60 mL/min (ref >60)
Glucose, Bld: 110 mg/dL — ABNORMAL HIGH (ref 70–99)
Potassium: 3.3 mmol/L — ABNORMAL LOW (ref 3.5–5.1)
Sodium: 143 mmol/L (ref 135–145)
Total Bilirubin: 0.5 mg/dL (ref 0.3–1.2)
Total Protein: 7 g/dL (ref 6.5–8.1)

## 2020-03-09 LAB — IRON AND TIBC
Iron: 113 ug/dL (ref 28–170)
Saturation Ratios: 27 % (ref 10.4–31.8)
TIBC: 423 ug/dL (ref 250–450)
UIBC: 310 ug/dL

## 2020-03-09 LAB — FERRITIN: Ferritin: 32 ng/mL (ref 11–307)

## 2020-03-09 NOTE — Progress Notes (Signed)
Patient here to establish care for anemia 

## 2020-03-09 NOTE — Progress Notes (Signed)
Hematology/Oncology Consult note [facility] [phone] [phone]   Patient Care Team: Baxter Hire, MD as PCP - General (Internal Medicine)  REFERRING PROVIDER: Jules Husbands, MD  CHIEF COMPLAINTS/REASON FOR VISIT:  Evaluation of right colon cancer  HISTORY OF PRESENTING ILLNESS:   Cynthia Mcgrath is a  80 y.o.  female with PMH listed below was seen in consultation at the request of  Pabon, Diego F, MD  for evaluation of right colon cancer  01/06/2020-01/07/2020, patient presented with symptomatic anemia, shortness of breath and fatigue with hemoglobin 6.8.  Patient was seen by gastroenterology.  Status post transfusion of 1 unit of PRBC, post transfusion hemoglobin 8.  Iron studies were consistent with iron deficiency. 01/12/2020, patient underwent upper endoscopy and colonoscopy. Upper endoscopy showed small hiatal hernia.  Multiple gastric polyps.  Biopsied. Colonoscopy showed 4 mm polyp in the ascending colon.  Sessile.  Pathology showed fundic gland polyps. Likely malignant tumor at the ileocecal valve biopsied.  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  2 mildly enlarged right lower quadrant mesenteric lymph nodes.  2 small nonspecific liver lesions as described above.  Too small to accurately characterize and most likely represent small cysts.  Sigmoid diverticulosis.  Bronchiectasis approximately 60% luminal stenosis involving proximal portion of the left subclavian artery due to soft plaque formation.  Calcified uterine fibroids.  Aortic atherosclerosis   Preop CEA is 4.8.  Patient is a non-smoker. 02/09/2020, underwent right hemicolectomy-by Dr.Pabon Pathology confirmed a invasive colorectal  adenocarcinoma, incidental tubular adenoma 0.7 cm and submucosal lipoma 2 cm.  24 lymph nodes were examined and 0 were positive.  Negative for LVI, perineural invasion. 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  Excludes the possibility of Lynch syndrome.  Patient was referred to establish care with oncology for further evaluation. Today patient was accompanied by her daughter [name].  Patient reports feeling well.  Denies any abdominal pain, fever, chills, bloody stool. Patient was previously advised to take oral iron supplementation which worsens her heartburn symptoms.  Also has constipation while on iron supplementation. . Review of Systems  Constitutional: Negative for appetite change, chills, fatigue and fever.  HENT:   Negative for hearing loss and voice change.   Eyes: Negative for eye problems.  Respiratory: Negative for chest tightness and cough.   Cardiovascular: Negative for chest pain.  Gastrointestinal: Negative for abdominal distention, abdominal pain and blood in stool.  Endocrine: Negative for hot flashes.  Genitourinary: Negative for difficulty urinating and frequency.   Musculoskeletal: Negative for arthralgias.  Skin: Negative for itching and rash.  Neurological: Negative for extremity weakness.  Hematological: Negative for adenopathy.  Psychiatric/Behavioral: Negative for confusion.    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   Sigmoid diverticulosis    Valvular heart disease     SURGICAL HISTORY: Past Surgical History:  Procedure Laterality Date   ACOUSTIC MENINGIOMA RESECTION Right 04/2004   APPENDECTOMY     BREAST EXCISIONAL BIOPSY Bilateral 1980's   NEG   CATARACT EXTRACTION, BILATERAL Bilateral    COLONOSCOPY     COLONOSCOPY WITH PROPOFOL N/A 01/12/2020   Procedure: COLONOSCOPY WITH PROPOFOL;  Surgeon: Lucilla Lame, MD;  Location: [facility] ENDOSCOPY;  Service: Endoscopy;  Laterality: N/A;   ESOPHAGOGASTRODUODENOSCOPY (EGD) WITH PROPOFOL N/A 01/12/2020   Procedure: ESOPHAGOGASTRODUODENOSCOPY (EGD) WITH PROPOFOL;  Surgeon: Lucilla Lame, MD;  Location: [facility] ENDOSCOPY;  Service: Endoscopy;  Laterality: N/A;   HEMORROIDECTOMY     IMPLANTATION VOCAL CORD Right    removal of brain tumor     SHOULDER ARTHROSCOPY Right    rotator cuff repair   THYROID GROWTH REMOVAL  1985    SOCIAL HISTORY: Social History   Socioeconomic History   Marital status: Widowed    Spouse name: Not on file   Number of children: Not on file   Years of education: Not on file   Highest education level: Not on file  Occupational History   Not on file  Tobacco Use   Smoking status: Never Smoker   Smokeless tobacco: Never Used  Vaping Use   Vaping Use: Never used  Substance and Sexual Activity   Alcohol use: Not Currently   Drug use: Never   Sexual activity: Not on file  Other Topics Concern   Not on file  Social History Narrative   Not on file   Social Determinants of Health   Financial Resource Strain: Not on file  Food Insecurity: Not on file  Transportation Needs: Not on file  Physical Activity: Not on file  Stress: Not on file  Social Connections: Not on file  Intimate Partner Violence: Not on file    FAMILY HISTORY: Family History  Problem Relation Age of Onset   Breast cancer Cousin 44   Hernia Mother    Dysphagia Mother    Lung  cancer Father    Brain cancer Brother     ALLERGIES:  is allergic to codeine.  MEDICATIONS:  Current Outpatient Medications  Medication Sig Dispense Refill   carvedilol (COREG) 3.125 MG tablet Take 3.125 mg by mouth 2 (two) times daily with a meal.     Fe Bisgly-Vit C-Vit B12-FA 28-60-0.008-0.4 MG CAPS Take 1-3 capsules by mouth daily. THORNE: IRON BISGLY     levothyroxine (SYNTHROID) 100 MCG tablet Take 100 mcg by mouth daily before breakfast.     LORazepam (ATIVAN) 1 MG tablet Take 1 mg by mouth 3 (three) times daily as needed for anxiety.     losartan (COZAAR) 25 MG tablet Take 25 mg by mouth daily.     omeprazole (PRILOSEC) 20 MG capsule Take 20 mg by mouth daily.     sertraline (ZOLOFT) 100 MG tablet Take 100 mg by mouth daily.     ferrous sulfate 325 (65 FE) MG tablet Take 1 tablet by mouth daily. (Patient not taking: Reported on 03/09/2020)     furosemide (LASIX) 20 MG tablet Take 20 mg by mouth daily as needed for edema or fluid. (Patient not taking: Reported on 03/09/2020)     loperamide (IMODIUM A-D) 2 MG tablet Take 1-2 tablets (2-4 mg total) by mouth 4 (  four) times daily as needed for diarrhea or loose stools. (Patient not taking: Reported on 03/09/2020) 30 tablet 0   No current facility-administered medications for this visit.     PHYSICAL EXAMINATION: ECOG PERFORMANCE STATUS: 1 - Symptomatic but completely ambulatory Vitals:   03/09/20 1127  BP: 119/76  Pulse: 87  Resp: 18  Temp: 99.1 F (37.3 C)   Filed Weights   03/09/20 1127  Weight: 137 lb 12.8 oz (62.5 kg)    Physical Exam Constitutional:      General: She is not in acute distress.    Comments: Patient walks independently  HENT:     Head: Normocephalic and atraumatic.  Eyes:     General: No scleral icterus. Cardiovascular:     Rate and Rhythm: Normal rate and regular rhythm.     Heart sounds: Normal heart sounds.  Pulmonary:     Effort: Pulmonary effort is normal. No respiratory distress.      Breath sounds: No wheezing.  Abdominal:     General: Bowel sounds are normal. There is no distension.     Palpations: Abdomen is soft.  Musculoskeletal:        General: No deformity. Normal range of motion.     Cervical back: Normal range of motion and neck supple.  Skin:    General: Skin is warm and dry.     Findings: No erythema or rash.  Neurological:     Mental Status: She is alert and oriented to person, place, and time. Mental status is at baseline.     Cranial Nerves: No cranial nerve deficit.     Coordination: Coordination normal.  Psychiatric:        Mood and Affect: Mood normal.     LABORATORY DATA:  I have reviewed the data as listed Lab Results  Component Value Date   WBC 4.0 03/09/2020   HGB 9.8 (L) 03/09/2020   HCT 32.7 (L) 03/09/2020   MCV 83.4 03/09/2020   PLT 285 03/09/2020   Recent Labs    01/21/20 1105 02/09/20 1418 02/11/20 0538 02/29/20 1126 03/09/20 1202  NA 144   < > 143 142 143  K 3.6   < > 3.5 3.0* 3.3*  CL 106   < > 110 104 104  CO2 26   < > _0 GLUCOSE 103*   < > 92 107* 110*  BUN 13   < > 6* 8 7*  CREATININE 0.82   < > 0.63 0.85 0.85  CALCIUM 8.6*   < > 8.1* 8.5* 8.6*  GFRNONAA >60   < > >60 >60 >60  PROT 6.5  --   --  6.7 7.0  ALBUMIN 3.2*  --   --  3.3* 3.5  AST 20  --   --  19 23  ALT 11  --   --  9 12  ALKPHOS 57  --   --  66 57  BILITOT 0.6  --   --  0.5 0.5   < > = values in this interval not displayed.   Iron/TIBC/Ferritin/ %Sat    Component Value Date/Time   IRON 113 03/09/2020 1202   TIBC 423 03/09/2020 1202   FERRITIN 32 03/09/2020 1202   IRONPCTSAT 27 03/09/2020 1202      RADIOGRAPHIC STUDIES: I have personally reviewed the radiological images as listed and agreed with the findings in the report. No results found.    ASSESSMENT & PLAN:  1. Iron deficiency anemia due to  chronic blood loss   2. Malignant neoplasm of ascending colon ([place])   3. Goals of care, counseling/discussion   Cancer  Staging Malignant neoplasm of ascending colon [facility]) Staging form: Colon and Rectum, AJCC 8th Edition - Pathologic stage from 03/09/2020: Stage I (pT2, pN0, cM0) - Signed by Earlie Server, MD on 03/09/2020   #Right colon cancer pT2pN0, BRAF positive Status post right hemicolectomy. Recommend patient to check postop CEA. No benefit of adjuvant chemotherapy treatment Discussed with patient about surveillance plan.  Recommend follow-up history/physical examination/labs in 3 months.  Repeat CT scan in 6 months.  #Iron deficiency anemia Check CBC, iron TIBC ferritin.  CMP -Labs are available after patient's visit. Hemoglobin 9.8, MCV 83.4 iron saturation showed ferritin of 32, iron saturation 27. Iron panel has improved.  Hemoglobin has not normalized yet. We will hold off IV Venofer treatments given that iron panel has improved.  Continue oral iron supplementation once daily.  Repeat labs in 3 months.  #Hypokalemia, potassium level 2.3.  Recommend patient increase food enriched with potassium.  Orders Placed This Encounter  Procedures   CBC with Differential/Platelet    Standing Status:   Future    Number of Occurrences:   1    Standing Expiration Date:   03/09/2021   Comprehensive metabolic panel    Standing Status:   Future    Number of Occurrences:   1    Standing Expiration Date:   03/09/2021   Ferritin    Standing Status:   Future    Number of Occurrences:   1    Standing Expiration Date:   03/09/2021   Iron and TIBC    Standing Status:   Future    Number of Occurrences:   1    Standing Expiration Date:   03/09/2021   CEA    Standing Status:   Future    Number of Occurrences:   1    Standing Expiration Date:   03/09/2021    All questions were answered. The patient knows to call the clinic with any problems questions or concerns.  cc Pabon, [place] F, MD    Return of visit: 3 months Thank you for this kind referral and the opportunity to participate in the care of this patient. A  copy of today's note is routed to referring provider    Earlie Server, MD, PhD Hematology Oncology [facility] at [facility] Pager- [phone] 03/09/2020

## 2020-03-10 ENCOUNTER — Other Ambulatory Visit: Payer: Medicare HMO

## 2020-03-10 LAB — CEA: CEA: 5.4 ng/mL — ABNORMAL HIGH (ref 0.0–4.7)

## 2020-03-11 NOTE — Progress Notes (Signed)
Tumor Board Documentation  DRINA JOBST was presented by Dr Janese Banks at our Tumor Board on 03/10/2020, which included representatives from medical oncology,radiation oncology,internal medicine,navigation,pathology,radiology,surgical,pharmacy,research,palliative care.  Tabita currently presents as a new patient,for MDC,for new positive pathology with history of the following treatments: surgical intervention(s),active survellience.  Additionally, we reviewed previous medical and familial history, history of present illness, and recent lab results along with all available histopathologic and imaging studies. The tumor board considered available treatment options and made the following recommendations: Active surveillance (CT in 6 months)    The following procedures/referrals were also placed: No orders of the defined types were placed in this encounter.   Clinical Trial Status: not discussed   Staging used: AJCC Stage Group  AJCC Staging: T: 2 N: 0 M: 0 Group: Stage I Adenocarcinoma of Right Colon   National site-specific guidelines NCCN were discussed with respect to the case.  Tumor board is a meeting of clinicians from various specialty areas who evaluate and discuss patients for whom a multidisciplinary approach is being considered. Final determinations in the plan of care are those of the provider(s). The responsibility for follow up of recommendations given during tumor board is that of the provider.   Today's extended care, comprehensive team conference, Carliyah was not present for the discussion and was not examined.   Multidisciplinary Tumor Board is a multidisciplinary case peer review process.  Decisions discussed in the Multidisciplinary Tumor Board reflect the opinions of the specialists present at the conference without having examined the patient.  Ultimately, treatment and diagnostic decisions rest with the primary provider(s) and the patient.

## 2020-03-15 ENCOUNTER — Telehealth: Payer: Self-pay

## 2020-03-15 DIAGNOSIS — C182 Malignant neoplasm of ascending colon: Secondary | ICD-10-CM

## 2020-03-15 NOTE — Telephone Encounter (Signed)
-----   Message from Earlie Server, MD sent at 03/14/2020 11:23 PM EDT ----- Let her know that her cancer marker is slightly elevated comparing to before her surgery. This is non specific but needs to be trended. Please arrange her to repeat CEA in 4 weeks.

## 2020-03-15 NOTE — Telephone Encounter (Signed)
Done  Pt has been sched for labs Only in 4 weeks as requested Pt will RTC on 04/12/20 @ 1:15

## 2020-03-15 NOTE — Telephone Encounter (Signed)
Patient notified via [place].  Please schedule as MD recommends and inform pt of appt details.

## 2020-04-12 ENCOUNTER — Inpatient Hospital Stay: Payer: Medicare HMO | Attending: Oncology

## 2020-04-12 DIAGNOSIS — C182 Malignant neoplasm of ascending colon: Secondary | ICD-10-CM | POA: Insufficient documentation

## 2020-04-13 LAB — CEA: CEA: 5.7 ng/mL — ABNORMAL HIGH (ref 0.0–4.7)

## 2020-04-19 ENCOUNTER — Telehealth: Payer: Self-pay

## 2020-04-19 DIAGNOSIS — C182 Malignant neoplasm of ascending colon: Secondary | ICD-10-CM

## 2020-04-19 NOTE — Telephone Encounter (Signed)
Unable to reach pt by phone, Left VM and sent Mychart message. Please schedule patient for lab (CEA) in approx 1 month and notify pt of appt.

## 2020-04-19 NOTE — Telephone Encounter (Signed)
FYI.. Pt has been sched for labs (CEA) in approx 1 month  as requested. I wasn't able to reach her either. An appt Reminder letter will be mailed out making her aware of her sched appt.

## 2020-04-19 NOTE — Telephone Encounter (Signed)
-----   Message from Earlie Server, MD sent at 04/18/2020 11:06 PM EDT ----- CEA is still high, recommend patient to repeat CEA in one month, if persistently high, plan to obtain CT. Will decide at that point.

## 2020-05-04 DIAGNOSIS — I38 Endocarditis, valve unspecified: Secondary | ICD-10-CM | POA: Diagnosis not present

## 2020-05-04 DIAGNOSIS — I5022 Chronic systolic (congestive) heart failure: Secondary | ICD-10-CM | POA: Diagnosis not present

## 2020-05-04 DIAGNOSIS — I429 Cardiomyopathy, unspecified: Secondary | ICD-10-CM | POA: Diagnosis not present

## 2020-05-19 ENCOUNTER — Other Ambulatory Visit: Payer: Self-pay

## 2020-05-19 ENCOUNTER — Inpatient Hospital Stay: Payer: Medicare HMO | Attending: Oncology

## 2020-05-19 DIAGNOSIS — C182 Malignant neoplasm of ascending colon: Secondary | ICD-10-CM | POA: Diagnosis not present

## 2020-05-20 LAB — CEA: CEA: 6.9 ng/mL — ABNORMAL HIGH (ref 0.0–4.7)

## 2020-05-23 ENCOUNTER — Telehealth: Payer: Self-pay

## 2020-05-23 DIAGNOSIS — C182 Malignant neoplasm of ascending colon: Secondary | ICD-10-CM

## 2020-05-23 NOTE — Telephone Encounter (Signed)
Per Mychart response message from Dr. Tasia Catchings: Please arrange her to do CT chest abdomen pelvis with contrast ASAP. Thanks. Yes, need for contrast, reason is rising CEA, history of colon cancer.    Spoke to [place] regarding plan of obtaining CT.

## 2020-05-24 ENCOUNTER — Other Ambulatory Visit: Payer: Self-pay

## 2020-05-24 DIAGNOSIS — C182 Malignant neoplasm of ascending colon: Secondary | ICD-10-CM

## 2020-05-24 DIAGNOSIS — D500 Iron deficiency anemia secondary to blood loss (chronic): Secondary | ICD-10-CM

## 2020-06-03 ENCOUNTER — Encounter: Payer: Self-pay | Admitting: Surgery

## 2020-06-09 ENCOUNTER — Inpatient Hospital Stay: Payer: Medicare HMO | Attending: Oncology

## 2020-06-09 ENCOUNTER — Encounter: Payer: Self-pay | Admitting: Oncology

## 2020-06-09 ENCOUNTER — Ambulatory Visit
Admission: RE | Admit: 2020-06-09 | Discharge: 2020-06-09 | Disposition: A | Discharge status: Home / Self Care | Payer: Medicare HMO | Source: Ambulatory Visit | Attending: Oncology | Admitting: Oncology

## 2020-06-09 ENCOUNTER — Other Ambulatory Visit: Payer: Self-pay

## 2020-06-09 DIAGNOSIS — Z801 Family history of malignant neoplasm of trachea, bronchus and lung: Secondary | ICD-10-CM | POA: Insufficient documentation

## 2020-06-09 DIAGNOSIS — R590 Localized enlarged lymph nodes: Secondary | ICD-10-CM | POA: Diagnosis not present

## 2020-06-09 DIAGNOSIS — Z8379 Family history of other diseases of the digestive system: Secondary | ICD-10-CM | POA: Insufficient documentation

## 2020-06-09 DIAGNOSIS — K449 Diaphragmatic hernia without obstruction or gangrene: Secondary | ICD-10-CM | POA: Diagnosis not present

## 2020-06-09 DIAGNOSIS — I429 Cardiomyopathy, unspecified: Secondary | ICD-10-CM | POA: Diagnosis not present

## 2020-06-09 DIAGNOSIS — K5730 Diverticulosis of large intestine without perforation or abscess without bleeding: Secondary | ICD-10-CM | POA: Insufficient documentation

## 2020-06-09 DIAGNOSIS — C182 Malignant neoplasm of ascending colon: Secondary | ICD-10-CM | POA: Insufficient documentation

## 2020-06-09 DIAGNOSIS — C189 Malignant neoplasm of colon, unspecified: Secondary | ICD-10-CM | POA: Diagnosis not present

## 2020-06-09 DIAGNOSIS — K5750 Diverticulosis of both small and large intestine without perforation or abscess without bleeding: Secondary | ICD-10-CM | POA: Diagnosis not present

## 2020-06-09 DIAGNOSIS — D509 Iron deficiency anemia, unspecified: Secondary | ICD-10-CM | POA: Insufficient documentation

## 2020-06-09 DIAGNOSIS — K317 Polyp of stomach and duodenum: Secondary | ICD-10-CM | POA: Diagnosis not present

## 2020-06-09 DIAGNOSIS — D500 Iron deficiency anemia secondary to blood loss (chronic): Secondary | ICD-10-CM

## 2020-06-09 DIAGNOSIS — Z79899 Other long term (current) drug therapy: Secondary | ICD-10-CM | POA: Insufficient documentation

## 2020-06-09 DIAGNOSIS — J479 Bronchiectasis, uncomplicated: Secondary | ICD-10-CM | POA: Insufficient documentation

## 2020-06-09 DIAGNOSIS — Z9049 Acquired absence of other specified parts of digestive tract: Secondary | ICD-10-CM | POA: Insufficient documentation

## 2020-06-09 DIAGNOSIS — K921 Melena: Secondary | ICD-10-CM | POA: Insufficient documentation

## 2020-06-09 DIAGNOSIS — Z803 Family history of malignant neoplasm of breast: Secondary | ICD-10-CM | POA: Diagnosis not present

## 2020-06-09 DIAGNOSIS — Z808 Family history of malignant neoplasm of other organs or systems: Secondary | ICD-10-CM | POA: Diagnosis not present

## 2020-06-09 DIAGNOSIS — D259 Leiomyoma of uterus, unspecified: Secondary | ICD-10-CM | POA: Insufficient documentation

## 2020-06-09 DIAGNOSIS — K5900 Constipation, unspecified: Secondary | ICD-10-CM | POA: Diagnosis not present

## 2020-06-09 DIAGNOSIS — K769 Liver disease, unspecified: Secondary | ICD-10-CM | POA: Insufficient documentation

## 2020-06-09 DIAGNOSIS — I700 Atherosclerosis of aorta: Secondary | ICD-10-CM | POA: Diagnosis not present

## 2020-06-09 DIAGNOSIS — K7689 Other specified diseases of liver: Secondary | ICD-10-CM | POA: Diagnosis not present

## 2020-06-09 DIAGNOSIS — Z885 Allergy status to narcotic agent status: Secondary | ICD-10-CM | POA: Insufficient documentation

## 2020-06-09 LAB — COMPREHENSIVE METABOLIC PANEL
ALT: 15 U/L (ref 0–44)
AST: 23 U/L (ref 15–41)
Albumin: 3.8 g/dL (ref 3.5–5.0)
Alkaline Phosphatase: 54 U/L (ref 38–126)
Anion gap: 11 (ref 5–15)
BUN: 12 mg/dL (ref 8–23)
CO2: 26 mmol/L (ref 22–32)
Calcium: 8.8 mg/dL — ABNORMAL LOW (ref 8.9–10.3)
Chloride: 104 mmol/L (ref 98–111)
Creatinine, Ser: 0.82 mg/dL (ref 0.44–1.00)
GFR, Estimated: >60 mL/min (ref >60)
Glucose, Bld: 94 mg/dL (ref 70–99)
Potassium: 4 mmol/L (ref 3.5–5.1)
Sodium: 141 mmol/L (ref 135–145)
Total Bilirubin: 0.4 mg/dL (ref 0.3–1.2)
Total Protein: 6.9 g/dL (ref 6.5–8.1)

## 2020-06-09 LAB — CBC WITH DIFFERENTIAL/PLATELET
Abs Immature Granulocytes: 0.01 10*3/uL (ref 0.00–0.07)
Basophils Absolute: 0 10*3/uL (ref 0.0–0.1)
Basophils Relative: 1 %
Eosinophils Absolute: 0.2 10*3/uL (ref 0.0–0.5)
Eosinophils Relative: 4 %
HCT: 38.8 % (ref 36.0–46.0)
Hemoglobin: 12.6 g/dL (ref 12.0–15.0)
Immature Granulocytes: 0 %
Lymphocytes Relative: 38 %
Lymphs Abs: 1.6 10*3/uL (ref 0.7–4.0)
MCH: 29 pg (ref 26.0–34.0)
MCHC: 32.5 g/dL (ref 30.0–36.0)
MCV: 89.2 fL (ref 80.0–100.0)
Monocytes Absolute: 0.4 10*3/uL (ref 0.1–1.0)
Monocytes Relative: 10 %
Neutro Abs: 2 10*3/uL (ref 1.7–7.7)
Neutrophils Relative %: 47 %
Platelets: 236 10*3/uL (ref 150–400)
RBC: 4.35 MIL/uL (ref 3.87–5.11)
RDW: 14.9 % (ref 11.5–15.5)
WBC: 4.3 10*3/uL (ref 4.0–10.5)
nRBC: 0 % (ref 0.0–0.2)

## 2020-06-09 LAB — IRON AND TIBC
Iron: 71 ug/dL (ref 28–170)
Saturation Ratios: 18 % (ref 10.4–31.8)
TIBC: 406 ug/dL (ref 250–450)
UIBC: 335 ug/dL

## 2020-06-09 LAB — FERRITIN: Ferritin: 15 ng/mL (ref 11–307)

## 2020-06-09 MED ORDER — IOHEXOL 300 MG/ML  SOLN
75.0000 mL | Freq: Once | INTRAMUSCULAR | Status: AC | PRN
  Administered 2020-06-09: 75 mL via INTRAVENOUS

## 2020-06-10 ENCOUNTER — Other Ambulatory Visit: Payer: Medicare HMO

## 2020-06-10 LAB — CEA: CEA: <0.3 ng/mL (ref 0.0–4.7)

## 2020-06-13 ENCOUNTER — Encounter: Payer: Self-pay | Admitting: Oncology

## 2020-06-13 ENCOUNTER — Inpatient Hospital Stay (HOSPITAL_BASED_OUTPATIENT_CLINIC_OR_DEPARTMENT_OTHER): Payer: Medicare HMO | Admitting: Oncology

## 2020-06-13 VITALS — BP 127/79 | HR 88 | Temp 99.3°F | Resp 18 | Wt 132.7 lb

## 2020-06-13 DIAGNOSIS — K449 Diaphragmatic hernia without obstruction or gangrene: Secondary | ICD-10-CM | POA: Diagnosis not present

## 2020-06-13 DIAGNOSIS — C182 Malignant neoplasm of ascending colon: Secondary | ICD-10-CM

## 2020-06-13 DIAGNOSIS — J479 Bronchiectasis, uncomplicated: Secondary | ICD-10-CM | POA: Diagnosis not present

## 2020-06-13 DIAGNOSIS — D259 Leiomyoma of uterus, unspecified: Secondary | ICD-10-CM | POA: Diagnosis not present

## 2020-06-13 DIAGNOSIS — Z7189 Other specified counseling: Secondary | ICD-10-CM | POA: Insufficient documentation

## 2020-06-13 DIAGNOSIS — I700 Atherosclerosis of aorta: Secondary | ICD-10-CM | POA: Diagnosis not present

## 2020-06-13 DIAGNOSIS — K769 Liver disease, unspecified: Secondary | ICD-10-CM | POA: Diagnosis not present

## 2020-06-13 DIAGNOSIS — K317 Polyp of stomach and duodenum: Secondary | ICD-10-CM | POA: Diagnosis not present

## 2020-06-13 DIAGNOSIS — D500 Iron deficiency anemia secondary to blood loss (chronic): Secondary | ICD-10-CM

## 2020-06-13 DIAGNOSIS — K5730 Diverticulosis of large intestine without perforation or abscess without bleeding: Secondary | ICD-10-CM | POA: Diagnosis not present

## 2020-06-13 DIAGNOSIS — R590 Localized enlarged lymph nodes: Secondary | ICD-10-CM | POA: Diagnosis not present

## 2020-06-13 NOTE — Progress Notes (Signed)
Hematology/Oncology Consult note [facility] [phone] [phone]   Patient Care Team: Baxter Hire, MD as PCP - General (Internal Medicine)  REFERRING PROVIDER: Baxter Hire, MD  CHIEF COMPLAINTS/REASON FOR VISIT:  right colon cancer  HISTORY OF PRESENTING ILLNESS:   Cynthia Mcgrath is a  80 y.o.  female with PMH listed below was seen in consultation at the request of  Baxter Hire, MD  for evaluation of right colon cancer  01/06/2020-01/07/2020, patient presented with symptomatic anemia, shortness of breath and fatigue with hemoglobin 6.8.  Patient was seen by gastroenterology.  Status post transfusion of 1 unit of PRBC, post transfusion hemoglobin 8.  Iron studies were consistent with iron deficiency. 01/12/2020, patient underwent upper endoscopy and colonoscopy. Upper endoscopy showed small hiatal hernia.  Multiple gastric polyps.  Biopsied. Colonoscopy showed 4 mm polyp in the ascending colon.  Sessile.  Pathology showed fundic gland polyps. Likely malignant tumor at the ileocecal valve biopsied.  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  2 mildly enlarged right lower quadrant mesenteric lymph nodes.  2 small nonspecific liver lesions as described above.  Too small to accurately characterize and most likely represent small cysts.  Sigmoid diverticulosis.  Bronchiectasis approximately 60% luminal stenosis involving proximal portion of the left subclavian artery due to soft plaque formation.  Calcified uterine fibroids.  Aortic atherosclerosis   Preop CEA is 4.8.  Patient is a non-smoker. 02/09/2020, underwent right hemicolectomy-by Dr.Pabon Pathology confirmed a invasive colorectal  adenocarcinoma, incidental tubular adenoma 0.7 cm and submucosal lipoma 2 cm.  24 lymph nodes were examined and 0 were positive.  Negative for LVI, perineural invasion. 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  Excludes the possibility of Lynch syndrome.  Patient was referred to establish care with oncology for further evaluation. Today patient was accompanied by her daughter [name].  Patient reports feeling well.  Denies any abdominal pain, fever, chills, bloody stool. Patient was previously advised to take oral iron supplementation which worsens her heartburn symptoms.  Also has constipation while on iron supplementation.  INTERVAL HISTORY Cynthia Mcgrath is a 80 y.o. female who has above history reviewed by me today presents for follow up visit for  Right colon cancer. Problems and complaints are listed below: Patient reports feeling better today.  She was accompanied by her daughter. Dark green stool.  Denies any blood in the stool.  She is on iron contained multivitamin supplementation.  Energy level has improved since last visit. . Review of Systems  Constitutional:  Negative for appetite change, chills, fatigue and fever.  HENT:   Negative for hearing loss and voice change.   Eyes:  Negative for eye problems.  Respiratory:  Negative for chest tightness and cough.   Cardiovascular:  Negative for chest pain.  Gastrointestinal:  Negative for abdominal distention, abdominal pain and blood in stool.  Endocrine: Negative for hot flashes.  Genitourinary:  Negative for difficulty urinating and frequency.   Musculoskeletal:  Negative for arthralgias.  Skin:  Negative for itching and rash.  Neurological:  Negative for extremity weakness.  Hematological:  Negative for adenopathy.  Psychiatric/Behavioral:  Negative for confusion.    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   Sigmoid diverticulosis    Valvular heart disease     SURGICAL HISTORY: Past Surgical History:  Procedure Laterality Date   ACOUSTIC MENINGIOMA RESECTION Right 04/2004   APPENDECTOMY     BREAST EXCISIONAL BIOPSY Bilateral 1980's   NEG   CATARACT EXTRACTION, BILATERAL Bilateral    COLONOSCOPY     COLONOSCOPY WITH PROPOFOL N/A 01/12/2020   Procedure: COLONOSCOPY WITH PROPOFOL;  Surgeon: Lucilla Lame, MD;  Location: [facility] ENDOSCOPY;  Service: Endoscopy;  Laterality: N/A;   ESOPHAGOGASTRODUODENOSCOPY (EGD) WITH PROPOFOL N/A 01/12/2020   Procedure: ESOPHAGOGASTRODUODENOSCOPY (EGD) WITH PROPOFOL;  Surgeon: Lucilla Lame, MD;  Location: [facility] ENDOSCOPY;  Service: Endoscopy;  Laterality: N/A;   HEMORROIDECTOMY     IMPLANTATION VOCAL CORD Right    removal of brain tumor     SHOULDER ARTHROSCOPY Right    rotator cuff repair   THYROID GROWTH REMOVAL  1985    SOCIAL HISTORY: Social History   Socioeconomic History   Marital status: Widowed    Spouse name: Not on file   Number of children: Not on file   Years of education: Not on file   Highest education level: Not on file  Occupational History   Not on file  Tobacco Use   Smoking status: Never   Smokeless tobacco: Never  Vaping Use   Vaping Use: Never used  Substance and Sexual Activity   Alcohol use: Not Currently   Drug use: Never   Sexual activity: Not on file  Other Topics Concern   Not on file  Social History Narrative   Not on file   Social Determinants of Health    Financial Resource Strain: Not on file  Food Insecurity: Not on file  Transportation Needs: Not on file  Physical Activity: Not on file  Stress: Not on file  Social Connections: Not on file  Intimate Partner Violence: Not on file    FAMILY HISTORY: Family History  Problem Relation Age of Onset   Breast cancer Cousin 77   Hernia Mother    Dysphagia Mother    Lung cancer Father    Brain cancer Brother     ALLERGIES:  is allergic to codeine.  MEDICATIONS:  Current Outpatient Medications  Medication Sig Dispense Refill   Calcium Carb-Cholecalciferol (CALTRATE 600+D3 PO) Take by mouth.     carvedilol (COREG) 3.125 MG tablet Take 3.125 mg by mouth 2 (two) times daily with a meal.     CRANBERRY FRUIT PO Take by mouth.     Fe Bisgly-Vit C-Vit B12-FA 28-60-0.008-0.4 MG CAPS Take 1-3 capsules by mouth daily. THORNE: IRON BISGLY     levothyroxine (SYNTHROID) 100 MCG tablet Take 100 mcg by mouth daily before breakfast.     LORazepam (ATIVAN) 1 MG tablet Take 1 mg by mouth 3 (three) times daily as needed for anxiety.     losartan (COZAAR) 25 MG tablet Take 25 mg  by mouth daily.     Omega-3 Fatty Acids (FISH OIL PO) Take by mouth.     omeprazole (PRILOSEC) 20 MG capsule Take 20 mg by mouth daily.     sertraline (ZOLOFT) 100 MG tablet Take 100 mg by mouth daily.     ferrous sulfate 325 (65 FE) MG tablet Take 1 tablet by mouth daily. (Patient not taking: No sig reported)     furosemide (LASIX) 20 MG tablet Take 20 mg by mouth daily as needed for edema or fluid. (Patient not taking: No sig reported)     loperamide (IMODIUM A-D) 2 MG tablet Take 1-2 tablets (2-4 mg total) by mouth 4 (four) times daily as needed for diarrhea or loose stools. (Patient not taking: No sig reported) 30 tablet 0   No current facility-administered medications for this visit.     PHYSICAL EXAMINATION: ECOG PERFORMANCE STATUS: 1 - Symptomatic but completely ambulatory Vitals:   06/13/20 1038  BP: 127/79   Pulse: 88  Resp: 18  Temp: 99.3 F (37.4 C)   Filed Weights   06/13/20 1038  Weight: 132 lb 11.2 oz (60.2 kg)    Physical Exam Constitutional:      General: She is not in acute distress.    Comments: Patient walks independently  HENT:     Head: Normocephalic and atraumatic.  Eyes:     General: No scleral icterus. Cardiovascular:     Rate and Rhythm: Normal rate and regular rhythm.     Heart sounds: Normal heart sounds.  Pulmonary:     Effort: Pulmonary effort is normal. No respiratory distress.     Breath sounds: No wheezing.  Abdominal:     General: Bowel sounds are normal. There is no distension.     Palpations: Abdomen is soft.  Musculoskeletal:        General: No deformity. Normal range of motion.     Cervical back: Normal range of motion and neck supple.  Skin:    General: Skin is warm and dry.     Findings: No erythema or rash.  Neurological:     Mental Status: She is alert and oriented to person, place, and time. Mental status is at baseline.     Cranial Nerves: No cranial nerve deficit.     Coordination: Coordination normal.  Psychiatric:        Mood and Affect: Mood normal.    LABORATORY DATA:  I have reviewed the data as listed Lab Results  Component Value Date   WBC 4.3 06/09/2020   HGB 12.6 06/09/2020   HCT 38.8 06/09/2020   MCV 89.2 06/09/2020   PLT 236 06/09/2020   Recent Labs    02/29/20 1126 03/09/20 1202 06/09/20 0800  NA 142 143 141  K 3.0* 3.3* 4.0  CL 104 104 104  CO2 28 29 26   GLUCOSE 107* 110* 94  BUN 8 7* 12  CREATININE 0.85 0.85 0.82  CALCIUM 8.5* 8.6* 8.8*  GFRNONAA >60 >60 >60  PROT 6.7 7.0 6.9  ALBUMIN 3.3* 3.5 3.8  AST 19 23 23   ALT 9 12 15   ALKPHOS 66 57 54  BILITOT 0.5 0.5 0.4    Iron/TIBC/Ferritin/ %Sat    Component Value Date/Time   IRON 71 06/09/2020 0800   TIBC 406 06/09/2020 0800   FERRITIN 15 06/09/2020 0800   IRONPCTSAT 18 06/09/2020 0800      RADIOGRAPHIC STUDIES: I have personally reviewed the  radiological images as listed and agreed with the findings in  the report. CT CHEST ABDOMEN PELVIS W CONTRAST  Result Date: 06/09/2020 CLINICAL DATA:  History of colon cancer, rising CEA, recent diagnosis of colon cancer at colonoscopy EXAM: CT CHEST, ABDOMEN, AND PELVIS WITH CONTRAST TECHNIQUE: Multidetector CT imaging of the chest, abdomen and pelvis was performed following the standard protocol during bolus administration of intravenous contrast. CONTRAST:  85m OMNIPAQUE IOHEXOL 300 MG/ML SOLN, additional oral enteric contrast COMPARISON:  01/25/2020 FINDINGS: CT CHEST FINDINGS Cardiovascular: Aortic atherosclerosis. Normal heart size. No pericardial effusion. Mediastinum/Nodes: No enlarged mediastinal, hilar, or axillary lymph nodes. Thyroid gland, trachea, and esophagus demonstrate no significant findings. Lungs/Pleura: Mild irregular peripheral interstitial opacity, septal thickening, and some suggestion of subpleural bronchiolectasis at the bilateral lung bases. No pleural effusion or pneumothorax. Musculoskeletal: No chest wall mass or suspicious bone lesions identified. CT ABDOMEN PELVIS FINDINGS Hepatobiliary: No solid liver abnormality is seen. Unchanged subcentimeter hypodense lesion of the medial right lobe of the liver (series 2, image 50). No gallstones, gallbladder wall thickening, or biliary dilatation. Pancreas: Unremarkable. No pancreatic ductal dilatation or surrounding inflammatory changes. Spleen: Normal in size without significant abnormality. Adrenals/Urinary Tract: Adrenal glands are unremarkable. Kidneys are normal, without renal calculi, solid lesion, or hydronephrosis. Bladder is unremarkable. Stomach/Bowel: Stomach is within normal limits. Interval postoperative findings of right hemicolectomy and reanastomosis. Sigmoid diverticulosis. Vascular/Lymphatic: Aortic atherosclerosis. Redemonstrated enlarged mesenteric lymph nodes in the right lower quadrant, which are diminished in size  compared to prior examination, larger of two adjacent nodes measuring 0.7 x 0.6 cm, previously 1.2 x 0.8 cm (series 2, image 70). Reproductive: Calcified uterine fibroids. Other: No abdominal wall hernia or abnormality. No abdominopelvic ascites. Musculoskeletal: No acute or significant osseous findings. IMPRESSION: 1. Interval postoperative findings of right hemicolectomy and reanastomosis. 2. Redemonstrated enlarged mesenteric lymph nodes in the right lower quadrant, which are diminished in size compared to prior examination, most consistent with treatment response of nodal metastatic disease. 3. No evidence of distant metastatic disease in the chest, abdomen, or pelvis. 4. Unchanged subcentimeter low-attenuation lesion of the medial right lobe of the liver, incompletely characterized although almost certainly a tiny cyst or hemangioma. Attention on follow-up. 5. Mild irregular peripheral interstitial opacity, septal thickening, and some suggestion of subpleural bronchiolectasis at the bilateral lung bases, suggestive of mild pulmonary fibrosis. This could be further characterized by pulmonology referral and dedicated ILD protocol CT of the chest if referable clinical signs and symptoms are present. Aortic Atherosclerosis (ICD10-I70.0). Electronically Signed   By: AEddie CandleM.D.   On: 06/09/2020 15:59      ASSESSMENT & PLAN:  1. Iron deficiency anemia due to chronic blood loss   2. Malignant neoplasm of ascending colon ([place]   3. Goals of care, counseling/discussion   Cancer Staging Malignant neoplasm of ascending colon ([facility] Staging form: Colon and Rectum, AJCC 8th Edition - Pathologic stage from 03/09/2020: Stage I (pT2, pN0, cM0) - Signed by YEarlie Server MD on 03/09/2020   #Stage I right colon cancer pT2pN0, BRAF positive Status post right hemicolectomy-02/09/2020. Preop CEA 4.7, rising postop CEA 5.4 --> 5.7--> 6.9--> < 0.3 06/09/2020 interval postoperative findings, enlarged mesenteric lymph node in  the right lower quadrant, diminished in size compared to prior examination.  No evidence of distant metastatic disease in the chest, abdomen and pelvis.  Unchanged subcentimeter low-attenuation lesion of the medial right lobe of the liver.  Probably cyst/hemangioma.  Attention on follow-up. Mild irregular peripheral interstitial opacity, septal thickening, subpleural bronchiectasis at the bilateral lung bases.  Suggestive of mild pulmonary  fibrosis. Discussed with patient that she will need repeat colonoscopy 1 year after surgery. Recommend patient to follow-up in 3 months with repeat tumor marker.  Plan CT chest abdomen pelvis in 6 months.  #Iron deficiency anemia Continue iron supplementation. Both hemoglobin and iron panel have improved. #Dark stool is most likely secondary to iron supplementation.  Recommend patient to hold off iron for a week to see if stool color normalizes to baseline.  Patient will call and update me if stool color is dark despite holding iron supplementation. #Possible lung fibrosis, advised patient to further discuss with primary care provider Dr. Edwina Barth to see if she needs to be referred to pulmonology for evaluation.  Orders Placed This Encounter  Procedures   CBC with Differential/Platelet    Standing Status:   Future    Standing Expiration Date:   06/13/2021   Comprehensive metabolic panel    Standing Status:   Future    Standing Expiration Date:   06/13/2021   CEA    Standing Status:   Future    Standing Expiration Date:   06/13/2021   Iron and TIBC    Standing Status:   Future    Standing Expiration Date:   06/13/2021   Ferritin    Standing Status:   Future    Standing Expiration Date:   06/13/2021    All questions were answered. The patient knows to call the clinic with any problems questions or concerns.  cc Baxter Hire, MD    Return of visit: 3 months   Earlie Server, MD, PhD Hematology Oncology [facility] Pager- [phone] 06/13/2020

## 2020-06-13 NOTE — Progress Notes (Signed)
Pt here for follow up. No new concerns voiced.   

## 2020-07-28 DIAGNOSIS — E034 Atrophy of thyroid (acquired): Secondary | ICD-10-CM | POA: Diagnosis not present

## 2020-07-28 DIAGNOSIS — R413 Other amnesia: Secondary | ICD-10-CM | POA: Diagnosis not present

## 2020-07-28 DIAGNOSIS — F411 Generalized anxiety disorder: Secondary | ICD-10-CM | POA: Diagnosis not present

## 2020-07-28 DIAGNOSIS — F32A Depression, unspecified: Secondary | ICD-10-CM | POA: Diagnosis not present

## 2020-08-29 DIAGNOSIS — I429 Cardiomyopathy, unspecified: Secondary | ICD-10-CM | POA: Diagnosis not present

## 2020-08-29 DIAGNOSIS — F411 Generalized anxiety disorder: Secondary | ICD-10-CM | POA: Diagnosis not present

## 2020-08-29 DIAGNOSIS — E7800 Pure hypercholesterolemia, unspecified: Secondary | ICD-10-CM | POA: Diagnosis not present

## 2020-08-29 DIAGNOSIS — E034 Atrophy of thyroid (acquired): Secondary | ICD-10-CM | POA: Diagnosis not present

## 2020-08-29 DIAGNOSIS — Z0000 Encounter for general adult medical examination without abnormal findings: Secondary | ICD-10-CM | POA: Diagnosis not present

## 2020-08-29 DIAGNOSIS — Z1389 Encounter for screening for other disorder: Secondary | ICD-10-CM | POA: Diagnosis not present

## 2020-08-29 DIAGNOSIS — F32A Depression, unspecified: Secondary | ICD-10-CM | POA: Diagnosis not present

## 2020-09-12 ENCOUNTER — Inpatient Hospital Stay: Payer: Medicare HMO | Attending: Oncology

## 2020-09-12 DIAGNOSIS — C182 Malignant neoplasm of ascending colon: Secondary | ICD-10-CM | POA: Insufficient documentation

## 2020-09-12 DIAGNOSIS — Z836 Family history of other diseases of the respiratory system: Secondary | ICD-10-CM | POA: Diagnosis not present

## 2020-09-12 DIAGNOSIS — Z803 Family history of malignant neoplasm of breast: Secondary | ICD-10-CM | POA: Diagnosis not present

## 2020-09-12 DIAGNOSIS — Z808 Family history of malignant neoplasm of other organs or systems: Secondary | ICD-10-CM | POA: Insufficient documentation

## 2020-09-12 DIAGNOSIS — Z8379 Family history of other diseases of the digestive system: Secondary | ICD-10-CM | POA: Insufficient documentation

## 2020-09-12 DIAGNOSIS — R0602 Shortness of breath: Secondary | ICD-10-CM | POA: Insufficient documentation

## 2020-09-12 DIAGNOSIS — Z79899 Other long term (current) drug therapy: Secondary | ICD-10-CM | POA: Diagnosis not present

## 2020-09-12 DIAGNOSIS — R5383 Other fatigue: Secondary | ICD-10-CM | POA: Insufficient documentation

## 2020-09-12 DIAGNOSIS — I700 Atherosclerosis of aorta: Secondary | ICD-10-CM | POA: Insufficient documentation

## 2020-09-12 DIAGNOSIS — R970 Elevated carcinoembryonic antigen [CEA]: Secondary | ICD-10-CM | POA: Insufficient documentation

## 2020-09-12 DIAGNOSIS — Z801 Family history of malignant neoplasm of trachea, bronchus and lung: Secondary | ICD-10-CM | POA: Insufficient documentation

## 2020-09-12 DIAGNOSIS — D259 Leiomyoma of uterus, unspecified: Secondary | ICD-10-CM | POA: Diagnosis not present

## 2020-09-12 DIAGNOSIS — D500 Iron deficiency anemia secondary to blood loss (chronic): Secondary | ICD-10-CM | POA: Insufficient documentation

## 2020-09-12 LAB — COMPREHENSIVE METABOLIC PANEL
ALT: 13 U/L (ref 0–44)
AST: 20 U/L (ref 15–41)
Albumin: 3.9 g/dL (ref 3.5–5.0)
Alkaline Phosphatase: 52 U/L (ref 38–126)
Anion gap: 7 (ref 5–15)
BUN: 12 mg/dL (ref 8–23)
CO2: 26 mmol/L (ref 22–32)
Calcium: 8.7 mg/dL — ABNORMAL LOW (ref 8.9–10.3)
Chloride: 106 mmol/L (ref 98–111)
Creatinine, Ser: 0.82 mg/dL (ref 0.44–1.00)
GFR, Estimated: >60 mL/min (ref >60)
Glucose, Bld: 104 mg/dL — ABNORMAL HIGH (ref 70–99)
Potassium: 4 mmol/L (ref 3.5–5.1)
Sodium: 139 mmol/L (ref 135–145)
Total Bilirubin: 0.7 mg/dL (ref 0.3–1.2)
Total Protein: 6.9 g/dL (ref 6.5–8.1)

## 2020-09-12 LAB — CBC WITH DIFFERENTIAL/PLATELET
Abs Immature Granulocytes: 0.02 10*3/uL (ref 0.00–0.07)
Basophils Absolute: 0 10*3/uL (ref 0.0–0.1)
Basophils Relative: 1 %
Eosinophils Absolute: 0.1 10*3/uL (ref 0.0–0.5)
Eosinophils Relative: 3 %
HCT: 38.5 % (ref 36.0–46.0)
Hemoglobin: 12.6 g/dL (ref 12.0–15.0)
Immature Granulocytes: 0 %
Lymphocytes Relative: 40 %
Lymphs Abs: 2.1 10*3/uL (ref 0.7–4.0)
MCH: 29.9 pg (ref 26.0–34.0)
MCHC: 32.7 g/dL (ref 30.0–36.0)
MCV: 91.4 fL (ref 80.0–100.0)
Monocytes Absolute: 0.4 10*3/uL (ref 0.1–1.0)
Monocytes Relative: 8 %
Neutro Abs: 2.4 10*3/uL (ref 1.7–7.7)
Neutrophils Relative %: 48 %
Platelets: 222 10*3/uL (ref 150–400)
RBC: 4.21 MIL/uL (ref 3.87–5.11)
RDW: 13.4 % (ref 11.5–15.5)
WBC: 5.1 10*3/uL (ref 4.0–10.5)
nRBC: 0 % (ref 0.0–0.2)

## 2020-09-12 LAB — IRON AND TIBC
Iron: 81 ug/dL (ref 28–170)
Saturation Ratios: 18 % (ref 10.4–31.8)
TIBC: 442 ug/dL (ref 250–450)
UIBC: 361 ug/dL

## 2020-09-12 LAB — FERRITIN: Ferritin: 11 ng/mL (ref 11–307)

## 2020-09-13 LAB — CEA: CEA: 5.4 ng/mL — ABNORMAL HIGH (ref 0.0–4.7)

## 2020-09-14 ENCOUNTER — Encounter: Payer: Self-pay | Admitting: Oncology

## 2020-09-14 ENCOUNTER — Inpatient Hospital Stay: Payer: Medicare HMO | Admitting: Oncology

## 2020-09-14 ENCOUNTER — Other Ambulatory Visit: Payer: Self-pay

## 2020-09-14 VITALS — BP 132/79 | HR 76 | Temp 99.4°F | Resp 16 | Wt 126.0 lb

## 2020-09-14 DIAGNOSIS — D259 Leiomyoma of uterus, unspecified: Secondary | ICD-10-CM | POA: Diagnosis not present

## 2020-09-14 DIAGNOSIS — D500 Iron deficiency anemia secondary to blood loss (chronic): Secondary | ICD-10-CM

## 2020-09-14 DIAGNOSIS — Z803 Family history of malignant neoplasm of breast: Secondary | ICD-10-CM | POA: Diagnosis not present

## 2020-09-14 DIAGNOSIS — R5383 Other fatigue: Secondary | ICD-10-CM | POA: Diagnosis not present

## 2020-09-14 DIAGNOSIS — C182 Malignant neoplasm of ascending colon: Secondary | ICD-10-CM

## 2020-09-14 DIAGNOSIS — R970 Elevated carcinoembryonic antigen [CEA]: Secondary | ICD-10-CM

## 2020-09-14 DIAGNOSIS — R0602 Shortness of breath: Secondary | ICD-10-CM | POA: Diagnosis not present

## 2020-09-14 DIAGNOSIS — I700 Atherosclerosis of aorta: Secondary | ICD-10-CM | POA: Diagnosis not present

## 2020-09-14 DIAGNOSIS — Z79899 Other long term (current) drug therapy: Secondary | ICD-10-CM | POA: Diagnosis not present

## 2020-09-14 NOTE — Progress Notes (Signed)
Hematology/Oncology Consult note [facility] [phone] [phone]   Patient Care Team: Baxter Hire, MD as PCP - General (Internal Medicine)  REFERRING PROVIDER: Baxter Hire, MD  CHIEF COMPLAINTS/REASON FOR VISIT:  Stage I right colon cancer  HISTORY OF PRESENTING ILLNESS:   Cynthia Mcgrath is a  80 y.o.  female with PMH listed below was seen in consultation at the request of  Baxter Hire, MD  for evaluation of right colon cancer  01/06/2020-01/07/2020, patient presented with symptomatic anemia, shortness of breath and fatigue with hemoglobin 6.8.  Patient was seen by gastroenterology.  Status post transfusion of 1 unit of PRBC, post transfusion hemoglobin 8.  Iron studies were consistent with iron deficiency. 01/12/2020, patient underwent upper endoscopy and colonoscopy. Upper endoscopy showed small hiatal hernia.  Multiple gastric polyps.  Biopsied. Colonoscopy showed 4 mm polyp in the ascending colon.  Sessile.  Pathology showed fundic gland polyps. Likely malignant tumor at the ileocecal valve biopsied.  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  2 mildly enlarged right lower quadrant mesenteric lymph nodes.  2 small nonspecific liver lesions as described above.  Too small to accurately characterize and most likely represent small cysts.  Sigmoid diverticulosis.  Bronchiectasis approximately 60% luminal stenosis involving proximal portion of the left subclavian artery due to soft plaque formation.  Calcified uterine fibroids.  Aortic atherosclerosis   Preop CEA is 4.8.  Patient is a non-smoker. 02/09/2020, underwent right hemicolectomy-by Dr.Pabon Pathology confirmed a invasive colorectal  adenocarcinoma, incidental tubular adenoma 0.7 cm and submucosal lipoma 2 cm.  24 lymph nodes were examined and 0 were positive.  Negative for LVI, perineural invasion. 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  Excludes the possibility of Lynch syndrome.  Patient was referred to establish care with oncology for further evaluation. Today patient was accompanied by her daughter [name].  Patient reports feeling well.  Denies any abdominal pain, fever, chills, bloody stool. Patient was previously advised to take oral iron supplementation which worsens her heartburn symptoms.  Also has constipation while on iron supplementation.  INTERVAL HISTORY Cynthia Mcgrath is a 80 y.o. female who has above history reviewed by me today presents for follow up visit for  Right colon cancer. Problems and complaints are listed below: Patient reports feeling better today.  She was accompanied by her husband.  Denies any new complaints. Occational diarrhea.  No fever, chills, unintentional weight loss.   . Review of Systems  Constitutional:  Negative for appetite change, chills, fatigue and fever.  HENT:   Negative for hearing loss and voice change.   Eyes:  Negative for eye problems.  Respiratory:  Negative for chest tightness and cough.   Cardiovascular:  Negative for chest pain.  Gastrointestinal:  Negative for abdominal distention, abdominal pain and blood in stool.  Endocrine: Negative for hot flashes.  Genitourinary:  Negative for difficulty urinating and frequency.   Musculoskeletal:  Negative for arthralgias.  Skin:  Negative for itching and rash.  Neurological:  Negative for extremity weakness.  Hematological:  Negative for adenopathy.  Psychiatric/Behavioral:  Negative for confusion.    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   Sigmoid diverticulosis    Valvular heart disease     SURGICAL HISTORY: Past Surgical History:  Procedure Laterality Date   ACOUSTIC MENINGIOMA RESECTION Right 04/2004   APPENDECTOMY     BREAST EXCISIONAL BIOPSY Bilateral 1980's   NEG   CATARACT EXTRACTION, BILATERAL Bilateral    COLONOSCOPY     COLONOSCOPY WITH PROPOFOL N/A 01/12/2020   Procedure: COLONOSCOPY WITH PROPOFOL;  Surgeon: Lucilla Lame, MD;  Location: [facility] ENDOSCOPY;  Service: Endoscopy;  Laterality: N/A;   ESOPHAGOGASTRODUODENOSCOPY (EGD) WITH PROPOFOL N/A 01/12/2020   Procedure: ESOPHAGOGASTRODUODENOSCOPY (EGD) WITH PROPOFOL;  Surgeon: Lucilla Lame, MD;  Location: [facility] ENDOSCOPY;  Service: Endoscopy;  Laterality: N/A;   HEMORROIDECTOMY     IMPLANTATION VOCAL CORD Right    removal of brain tumor     SHOULDER ARTHROSCOPY Right    rotator cuff repair   THYROID GROWTH REMOVAL  1985    SOCIAL HISTORY: Social History   Socioeconomic History   Marital status: Widowed    Spouse name: Not on file   Number of children: Not on file   Years of education: Not on file   Highest education level: Not on file  Occupational History   Not on file  Tobacco Use   Smoking status: Never   Smokeless tobacco: Never  Vaping Use   Vaping Use: Never used  Substance and Sexual Activity   Alcohol use: Not Currently   Drug use: Never   Sexual activity: Not on file  Other Topics Concern   Not on file  Social History Narrative   Not on file   Social Determinants of Health   Financial Resource Strain: Not on file  Food  Insecurity: Not on file  Transportation Needs: Not on file  Physical Activity: Not on file  Stress: Not on file  Social Connections: Not on file  Intimate Partner Violence: Not on file    FAMILY HISTORY: Family History  Problem Relation Age of Onset   Breast cancer Cousin 13   Hernia Mother    Dysphagia Mother    Lung cancer Father    Brain cancer Brother     ALLERGIES:  is allergic to codeine.  MEDICATIONS:  Current Outpatient Medications  Medication Sig Dispense Refill   busPIRone (BUSPAR) 5 MG tablet Take 1 tablet by mouth 2 (two) times daily.     Calcium Carb-Cholecalciferol (CALTRATE 600+D3 PO) Take by mouth.     carvedilol (COREG) 3.125 MG tablet Take 3.125 mg by mouth 2 (two) times daily with a meal.     levothyroxine (SYNTHROID) 100 MCG tablet Take 100 mcg by mouth daily before breakfast.     LORazepam (ATIVAN) 1 MG tablet Take 1 mg by mouth 3 (three) times daily as needed for anxiety.     losartan (COZAAR) 25 MG tablet Take 25 mg by mouth daily.     Omega-3 Fatty Acids (FISH OIL PO) Take by mouth.  omeprazole (PRILOSEC) 20 MG capsule Take 20 mg by mouth daily.     sertraline (ZOLOFT) 100 MG tablet Take 100 mg by mouth daily.     Fe Bisgly-Vit C-Vit B12-FA 28-60-0.008-0.4 MG CAPS Take 1-3 capsules by mouth daily. THORNE: IRON BISGLY (Patient not taking: Reported on 09/14/2020)     loperamide (IMODIUM A-D) 2 MG tablet Take 1-2 tablets (2-4 mg total) by mouth 4 (four) times daily as needed for diarrhea or loose stools. (Patient not taking: No sig reported) 30 tablet 0   No current facility-administered medications for this visit.     PHYSICAL EXAMINATION: ECOG PERFORMANCE STATUS: 1 - Symptomatic but completely ambulatory Vitals:   09/14/20 1032  BP: 132/79  Pulse: 76  Resp: 16  Temp: 99.4 F (37.4 C)  SpO2: 97%   Filed Weights   09/14/20 1032  Weight: 126 lb (57.2 kg)    Physical Exam Constitutional:      General: She is not in acute distress.     Comments: Patient walks independently  HENT:     Head: Normocephalic and atraumatic.  Eyes:     General: No scleral icterus. Cardiovascular:     Rate and Rhythm: Normal rate and regular rhythm.     Heart sounds: Normal heart sounds.  Pulmonary:     Effort: Pulmonary effort is normal. No respiratory distress.     Breath sounds: No wheezing.  Abdominal:     General: Bowel sounds are normal. There is no distension.     Palpations: Abdomen is soft.  Musculoskeletal:        General: No deformity. Normal range of motion.     Cervical back: Normal range of motion and neck supple.  Skin:    General: Skin is warm and dry.     Findings: No erythema or rash.  Neurological:     Mental Status: She is alert and oriented to person, place, and time. Mental status is at baseline.     Cranial Nerves: No cranial nerve deficit.     Coordination: Coordination normal.  Psychiatric:        Mood and Affect: Mood normal.    LABORATORY DATA:  I have reviewed the data as listed Lab Results  Component Value Date   WBC 5.1 09/12/2020   HGB 12.6 09/12/2020   HCT 38.5 09/12/2020   MCV 91.4 09/12/2020   PLT 222 09/12/2020   Recent Labs    03/09/20 1202 06/09/20 0800 09/12/20 0858  NA 143 141 139  K 3.3* 4.0 4.0  CL 104 104 106  CO2 29 26 26   GLUCOSE 110* 94 104*  BUN 7* 12 12  CREATININE 0.85 0.82 0.82  CALCIUM 8.6* 8.8* 8.7*  GFRNONAA >60 >60 >60  PROT 7.0 6.9 6.9  ALBUMIN 3.5 3.8 3.9  AST 23 23 20   ALT 12 15 13   ALKPHOS 57 54 52  BILITOT 0.5 0.4 0.7    Iron/TIBC/Ferritin/ %Sat    Component Value Date/Time   IRON 81 09/12/2020 0858   TIBC 442 09/12/2020 0858   FERRITIN 11 09/12/2020 0858   IRONPCTSAT 18 09/12/2020 0858      RADIOGRAPHIC STUDIES: I have personally reviewed the radiological images as listed and agreed with the findings in the report. No results found.     ASSESSMENT & PLAN:  1. Iron deficiency anemia due to chronic blood loss   2. Malignant neoplasm of  ascending colon (HCC)   3. Elevated CEA   Cancer Staging Malignant neoplasm of  ascending colon [facility]) Staging form: Colon and Rectum, AJCC 8th Edition - Pathologic stage from 03/09/2020: Stage I (pT2, pN0, cM0) - Signed by Earlie Server, MD on 03/09/2020   #Stage I right colon cancer pT2pN0, BRAF positive Status post right hemicolectomy-02/09/2020. Preop CEA 4.7, rising postop CEA 5.4 --> 5.7--> 6.9--> < 0.3 --> 5.4 Rising CEA, ? Cancer recurrence vs chronic inflammation,  Repeat CEA next week.  check CT chest abdomen pelvis.    #Iron deficiency anemia Continue oral iron supplementation.   #Possible lung fibrosis, advised patient to further discuss with primary care provider Dr. Edwina Barth to see if she needs to be referred to pulmonology for evaluation.  Orders Placed This Encounter  Procedures   CT CHEST ABDOMEN PELVIS W CONTRAST    Standing Status:   Future    Standing Expiration Date:   09/14/2021    Order Specific Question:   Preferred imaging location?    Answer:   [facility]    Order Specific Question:   Is Oral Contrast requested for this exam?    Answer:   Yes, Per Radiology protocol    Order Specific Question:   Reason for Exam (SYMPTOM  OR DIAGNOSIS REQUIRED)    Answer:   history of colon cancer   CEA    Standing Status:   Future    Standing Expiration Date:   09/14/2021   Comprehensive metabolic panel    Standing Status:   Future    Standing Expiration Date:   09/14/2021   CBC with Differential/Platelet    Standing Status:   Future    Standing Expiration Date:   09/14/2021   CEA    Standing Status:   Future    Standing Expiration Date:   09/14/2021    All questions were answered. The patient knows to call the clinic with any problems questions or concerns.  cc Baxter Hire, MD    Return of visit: 3 months   Earlie Server, MD, PhD Hematology Oncology [place] [facility]  09/14/2020

## 2020-09-14 NOTE — Progress Notes (Signed)
Pt and husband in for follow up.  Pt states has been more depressed and has had some medication changes. Pt states "but physically I feel the best I have in a long time".  Pt had requested through mychart some medication removal of some "heart and bp meds" but pt is taking them and request they stay on record.

## 2020-09-19 DIAGNOSIS — H35373 Puckering of macula, bilateral: Secondary | ICD-10-CM | POA: Diagnosis not present

## 2020-09-19 DIAGNOSIS — Z0100 Encounter for examination of eyes and vision without abnormal findings: Secondary | ICD-10-CM | POA: Diagnosis not present

## 2020-09-20 ENCOUNTER — Inpatient Hospital Stay: Payer: Medicare HMO

## 2020-09-20 DIAGNOSIS — Z803 Family history of malignant neoplasm of breast: Secondary | ICD-10-CM | POA: Diagnosis not present

## 2020-09-20 DIAGNOSIS — R0602 Shortness of breath: Secondary | ICD-10-CM | POA: Diagnosis not present

## 2020-09-20 DIAGNOSIS — D259 Leiomyoma of uterus, unspecified: Secondary | ICD-10-CM | POA: Diagnosis not present

## 2020-09-20 DIAGNOSIS — C182 Malignant neoplasm of ascending colon: Secondary | ICD-10-CM

## 2020-09-20 DIAGNOSIS — Z79899 Other long term (current) drug therapy: Secondary | ICD-10-CM | POA: Diagnosis not present

## 2020-09-20 DIAGNOSIS — D500 Iron deficiency anemia secondary to blood loss (chronic): Secondary | ICD-10-CM | POA: Diagnosis not present

## 2020-09-20 DIAGNOSIS — R970 Elevated carcinoembryonic antigen [CEA]: Secondary | ICD-10-CM | POA: Diagnosis not present

## 2020-09-20 DIAGNOSIS — R5383 Other fatigue: Secondary | ICD-10-CM | POA: Diagnosis not present

## 2020-09-20 DIAGNOSIS — I700 Atherosclerosis of aorta: Secondary | ICD-10-CM | POA: Diagnosis not present

## 2020-09-21 LAB — CEA: CEA: 5.5 ng/mL — ABNORMAL HIGH (ref 0.0–4.7)

## 2020-09-27 ENCOUNTER — Other Ambulatory Visit: Payer: Self-pay

## 2020-09-27 ENCOUNTER — Ambulatory Visit
Admission: RE | Admit: 2020-09-27 | Discharge: 2020-09-27 | Disposition: A | Discharge status: Home / Self Care | Payer: Medicare HMO | Source: Ambulatory Visit | Attending: Oncology | Admitting: Oncology

## 2020-09-27 DIAGNOSIS — C182 Malignant neoplasm of ascending colon: Secondary | ICD-10-CM | POA: Insufficient documentation

## 2020-09-27 DIAGNOSIS — J479 Bronchiectasis, uncomplicated: Secondary | ICD-10-CM | POA: Diagnosis not present

## 2020-09-27 DIAGNOSIS — R634 Abnormal weight loss: Secondary | ICD-10-CM | POA: Diagnosis not present

## 2020-09-27 DIAGNOSIS — I2510 Atherosclerotic heart disease of native coronary artery without angina pectoris: Secondary | ICD-10-CM | POA: Diagnosis not present

## 2020-09-27 DIAGNOSIS — C189 Malignant neoplasm of colon, unspecified: Secondary | ICD-10-CM | POA: Diagnosis not present

## 2020-09-27 DIAGNOSIS — I700 Atherosclerosis of aorta: Secondary | ICD-10-CM | POA: Diagnosis not present

## 2020-09-27 DIAGNOSIS — K5730 Diverticulosis of large intestine without perforation or abscess without bleeding: Secondary | ICD-10-CM | POA: Diagnosis not present

## 2020-09-27 DIAGNOSIS — D259 Leiomyoma of uterus, unspecified: Secondary | ICD-10-CM | POA: Diagnosis not present

## 2020-09-27 MED ORDER — IOHEXOL 350 MG/ML SOLN
75.0000 mL | Freq: Once | INTRAVENOUS | Status: AC | PRN
  Administered 2020-09-27: 75 mL via INTRAVENOUS

## 2020-09-28 ENCOUNTER — Encounter: Payer: Self-pay | Admitting: Oncology

## 2020-09-28 NOTE — Telephone Encounter (Signed)
Please advise on CT results.

## 2020-09-29 DIAGNOSIS — E7800 Pure hypercholesterolemia, unspecified: Secondary | ICD-10-CM | POA: Diagnosis not present

## 2020-09-29 DIAGNOSIS — I5022 Chronic systolic (congestive) heart failure: Secondary | ICD-10-CM | POA: Diagnosis not present

## 2020-09-29 DIAGNOSIS — I38 Endocarditis, valve unspecified: Secondary | ICD-10-CM | POA: Diagnosis not present

## 2020-09-29 DIAGNOSIS — F32A Depression, unspecified: Secondary | ICD-10-CM | POA: Diagnosis not present

## 2020-09-29 DIAGNOSIS — I771 Stricture of artery: Secondary | ICD-10-CM | POA: Diagnosis not present

## 2020-10-11 ENCOUNTER — Other Ambulatory Visit: Payer: Self-pay | Admitting: Internal Medicine

## 2020-10-11 DIAGNOSIS — Z1231 Encounter for screening mammogram for malignant neoplasm of breast: Secondary | ICD-10-CM

## 2020-10-14 ENCOUNTER — Ambulatory Visit
Admission: RE | Admit: 2020-10-14 | Discharge: 2020-10-14 | Disposition: A | Discharge status: Home / Self Care | Payer: Medicare HMO | Source: Ambulatory Visit | Attending: Internal Medicine | Admitting: Internal Medicine

## 2020-10-14 ENCOUNTER — Other Ambulatory Visit: Payer: Self-pay

## 2020-10-14 DIAGNOSIS — Z1231 Encounter for screening mammogram for malignant neoplasm of breast: Secondary | ICD-10-CM | POA: Insufficient documentation

## 2020-10-19 ENCOUNTER — Other Ambulatory Visit: Payer: Self-pay

## 2020-10-19 ENCOUNTER — Telehealth: Payer: Self-pay | Admitting: *Deleted

## 2020-10-19 DIAGNOSIS — R197 Diarrhea, unspecified: Secondary | ICD-10-CM

## 2020-10-19 NOTE — Telephone Encounter (Signed)
Per Dr Tasia Catchings "please get her to see [facility].  she just had a CT done which shows no cancer recurrence. probably gastroenteritis. "

## 2020-10-19 NOTE — Telephone Encounter (Signed)
Pt has been contacted and agreed to be seen in [facility] tomorrow. Appointments scheduled.

## 2020-10-19 NOTE — Telephone Encounter (Signed)
Patient called reporting that she has had black watery diarrhea twice a day for 3 weeks now and that she is also now having dizziness. She is asking to have her appointment moved up. Please advise

## 2020-10-20 ENCOUNTER — Ambulatory Visit
Admission: RE | Admit: 2020-10-20 | Discharge: 2020-10-20 | Disposition: A | Discharge status: Home / Self Care | Payer: Medicare HMO | Source: Ambulatory Visit | Attending: Oncology | Admitting: Oncology

## 2020-10-20 ENCOUNTER — Inpatient Hospital Stay: Payer: Medicare HMO | Attending: Oncology

## 2020-10-20 ENCOUNTER — Other Ambulatory Visit: Payer: Self-pay

## 2020-10-20 ENCOUNTER — Ambulatory Visit
Admission: RE | Admit: 2020-10-20 | Discharge: 2020-10-20 | Disposition: A | Discharge status: Home / Self Care | Payer: Medicare HMO | Attending: Oncology | Admitting: Oncology

## 2020-10-20 ENCOUNTER — Inpatient Hospital Stay (HOSPITAL_BASED_OUTPATIENT_CLINIC_OR_DEPARTMENT_OTHER): Payer: Medicare HMO | Admitting: Oncology

## 2020-10-20 ENCOUNTER — Encounter: Payer: Self-pay | Admitting: Oncology

## 2020-10-20 VITALS — BP 96/55 | HR 70 | Temp 98.2°F | Wt 118.0 lb

## 2020-10-20 DIAGNOSIS — Z8379 Family history of other diseases of the digestive system: Secondary | ICD-10-CM | POA: Diagnosis not present

## 2020-10-20 DIAGNOSIS — K5730 Diverticulosis of large intestine without perforation or abscess without bleeding: Secondary | ICD-10-CM | POA: Diagnosis not present

## 2020-10-20 DIAGNOSIS — R1084 Generalized abdominal pain: Secondary | ICD-10-CM

## 2020-10-20 DIAGNOSIS — R197 Diarrhea, unspecified: Secondary | ICD-10-CM | POA: Diagnosis not present

## 2020-10-20 DIAGNOSIS — Z801 Family history of malignant neoplasm of trachea, bronchus and lung: Secondary | ICD-10-CM | POA: Insufficient documentation

## 2020-10-20 DIAGNOSIS — Z803 Family history of malignant neoplasm of breast: Secondary | ICD-10-CM | POA: Diagnosis not present

## 2020-10-20 DIAGNOSIS — Z79899 Other long term (current) drug therapy: Secondary | ICD-10-CM | POA: Diagnosis not present

## 2020-10-20 DIAGNOSIS — D500 Iron deficiency anemia secondary to blood loss (chronic): Secondary | ICD-10-CM | POA: Diagnosis not present

## 2020-10-20 DIAGNOSIS — Z23 Encounter for immunization: Secondary | ICD-10-CM | POA: Insufficient documentation

## 2020-10-20 DIAGNOSIS — D72829 Elevated white blood cell count, unspecified: Secondary | ICD-10-CM | POA: Diagnosis not present

## 2020-10-20 DIAGNOSIS — Z836 Family history of other diseases of the respiratory system: Secondary | ICD-10-CM | POA: Diagnosis not present

## 2020-10-20 DIAGNOSIS — C182 Malignant neoplasm of ascending colon: Secondary | ICD-10-CM | POA: Diagnosis not present

## 2020-10-20 DIAGNOSIS — R0602 Shortness of breath: Secondary | ICD-10-CM | POA: Insufficient documentation

## 2020-10-20 DIAGNOSIS — Z808 Family history of malignant neoplasm of other organs or systems: Secondary | ICD-10-CM | POA: Diagnosis not present

## 2020-10-20 DIAGNOSIS — I700 Atherosclerosis of aorta: Secondary | ICD-10-CM | POA: Diagnosis not present

## 2020-10-20 DIAGNOSIS — R5383 Other fatigue: Secondary | ICD-10-CM | POA: Diagnosis not present

## 2020-10-20 DIAGNOSIS — R109 Unspecified abdominal pain: Secondary | ICD-10-CM | POA: Diagnosis not present

## 2020-10-20 LAB — COMPREHENSIVE METABOLIC PANEL
ALT: 21 U/L (ref 0–44)
AST: 28 U/L (ref 15–41)
Albumin: 3.7 g/dL (ref 3.5–5.0)
Alkaline Phosphatase: 76 U/L (ref 38–126)
Anion gap: 7 (ref 5–15)
BUN: 16 mg/dL (ref 8–23)
CO2: 28 mmol/L (ref 22–32)
Calcium: 8.8 mg/dL — ABNORMAL LOW (ref 8.9–10.3)
Chloride: 99 mmol/L (ref 98–111)
Creatinine, Ser: 0.9 mg/dL (ref 0.44–1.00)
GFR, Estimated: >60 mL/min (ref >60)
Glucose, Bld: 120 mg/dL — ABNORMAL HIGH (ref 70–99)
Potassium: 4.7 mmol/L (ref 3.5–5.1)
Sodium: 134 mmol/L — ABNORMAL LOW (ref 135–145)
Total Bilirubin: 1.1 mg/dL (ref 0.3–1.2)
Total Protein: 7.5 g/dL (ref 6.5–8.1)

## 2020-10-20 LAB — GASTROINTESTINAL PANEL BY PCR, STOOL (REPLACES STOOL CULTURE)

## 2020-10-20 LAB — CBC WITH DIFFERENTIAL/PLATELET
Abs Immature Granulocytes: 0.06 10*3/uL (ref 0.00–0.07)
Basophils Absolute: 0 10*3/uL (ref 0.0–0.1)
Basophils Relative: 0 %
Eosinophils Absolute: 0.1 10*3/uL (ref 0.0–0.5)
Eosinophils Relative: 1 %
HCT: 38.6 % (ref 36.0–46.0)
Hemoglobin: 12.9 g/dL (ref 12.0–15.0)
Immature Granulocytes: 1 %
Lymphocytes Relative: 16 %
Lymphs Abs: 1.7 10*3/uL (ref 0.7–4.0)
MCH: 30.2 pg (ref 26.0–34.0)
MCHC: 33.4 g/dL (ref 30.0–36.0)
MCV: 90.4 fL (ref 80.0–100.0)
Monocytes Absolute: 1.3 10*3/uL — ABNORMAL HIGH (ref 0.1–1.0)
Monocytes Relative: 12 %
Neutro Abs: 7.7 10*3/uL (ref 1.7–7.7)
Neutrophils Relative %: 70 %
Platelets: 206 10*3/uL (ref 150–400)
RBC: 4.27 MIL/uL (ref 3.87–5.11)
RDW: 14.4 % (ref 11.5–15.5)
WBC: 10.9 10*3/uL — ABNORMAL HIGH (ref 4.0–10.5)
nRBC: 0 % (ref 0.0–0.2)

## 2020-10-20 LAB — C DIFFICILE QUICK SCREEN W PCR REFLEX
C Diff antigen: NEGATIVE
C Diff interpretation: NOT DETECTED
C Diff toxin: NEGATIVE

## 2020-10-20 NOTE — Progress Notes (Signed)
Pt reports intermittent diarrhea for about 2 weeks now. She states that she will have black, watery, diarrhea 2-3 times in one day, but not every day. Reports current abdominal pain, and describes as cramps, but has not had a BM today.

## 2020-10-20 NOTE — Progress Notes (Signed)
Symptom Management Consult note [facility]  [phone] [phone]  Patient Care Team: Baxter Hire, MD as PCP - General (Internal Medicine)   Name of the patient: Cynthia Mcgrath  [id number]  08/16/1940   Date of visit: 10/20/2020   Diagnosis-colon cancer  Chief complaint/ Reason for visit-diarrhea  Heme/Onc history: Cynthia Mcgrath is an 80 year old female with multiple medical problems who is followed by Cynthia Mcgrath for stage I colon cancer.  She presented with symptomatic anemia, shortness of breath and fatigue back in January 2022 and was found to have a hemoglobin of 6.8.  Work-up included colonoscopy/endoscopy which showed a multiple gastric polyps and ileocecal valve tumor.  This was biopsied and showed invasive moderately differentiated adenocarcinoma.  Imaging from 01/25/2020 showed almost obstructing right colon mass, 2 mesenteric lymph nodes and 2 small nonspecific liver lesions.  Underwent right hemicolectomy confirming colorectal adenocarcinoma.  Genetic testing showed BRAF mutation positive.  She had rising tumor markers so a CT abdomen/pelvis was performed on 09/27/2020 that did not reveal any new or progressive findings to suggest recurrent disease.  Interval history-Cynthia Mcgrath presents to clinic today due to intermittent loose stools for approximately 2 weeks now.  Reports 2-3 episodes daily although she has not had a bowel movement in 3 days.  Describes them as black and watery.  Endorses abdominal pain like cramping that is generalized and worse today..  Denies any nausea or vomiting.  Denies previous history of abdominal pain.  Denies any suspicious drinks or food.  Denies a fever or recent infection.  ECOG FS:1 - Symptomatic but completely ambulatory  Review of systems- Review of Systems  Gastrointestinal:  Positive for abdominal pain and diarrhea.    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   Sigmoid diverticulosis    Valvular heart disease      Past Surgical History:  Procedure Laterality Date   ACOUSTIC MENINGIOMA RESECTION Right 04/2004   APPENDECTOMY     BREAST EXCISIONAL BIOPSY Bilateral 1980's   NEG   CATARACT EXTRACTION, BILATERAL Bilateral    COLONOSCOPY     COLONOSCOPY WITH PROPOFOL N/A 01/12/2020   Procedure: COLONOSCOPY WITH PROPOFOL;  Surgeon: Lucilla Lame, MD;  Location: [facility];  Service: Endoscopy;  Laterality: N/A;   ESOPHAGOGASTRODUODENOSCOPY (EGD) WITH PROPOFOL N/A 01/12/2020   Procedure: ESOPHAGOGASTRODUODENOSCOPY (EGD) WITH PROPOFOL;  Surgeon: Lucilla Lame, MD;  Location: [facility] ENDOSCOPY;  Service: Endoscopy;  Laterality: N/A;   HEMORROIDECTOMY     IMPLANTATION VOCAL CORD Right    removal of brain tumor     SHOULDER ARTHROSCOPY Right    rotator cuff repair   THYROID GROWTH REMOVAL  1985    Social History   Socioeconomic History   Marital status: Widowed    Spouse name: Not on file   Number of children:  Not on file   Years of education: Not on file   Highest education level: Not on file  Occupational History   Not on file  Tobacco Use   Smoking status: Never   Smokeless tobacco: Never  Vaping Use   Vaping Use: Never used  Substance and Sexual Activity   Alcohol use: Not Currently   Drug use: Never   Sexual activity: Not on file  Other Topics Concern   Not on file  Social History Narrative   Not on  file   Social Determinants of Health   Financial Resource Strain: Not on file  Food Insecurity: Not on file  Transportation Needs: Not on file  Physical Activity: Not on file  Stress: Not on file  Social Connections: Not on file  Intimate Partner Violence: Not on file    Family History  Problem Relation Age of Onset   Breast cancer Cousin 73   Hernia Mother    Dysphagia Mother    Lung cancer Father    Brain cancer Brother      Current Outpatient Medications:    busPIRone (BUSPAR) 5 MG tablet, Take 1 tablet by mouth 2 (two) times daily., Disp: , Rfl:    Calcium Carb-Cholecalciferol (CALTRATE 600+D3 PO), Take by mouth., Disp: , Rfl:    carvedilol (COREG) 6.25 MG tablet, , Disp: , Rfl:    Fe Bisgly-Vit C-Vit B12-FA 28-60-0.008-0.4 MG CAPS, Take 1-3 capsules by mouth daily. THORNE: IRON BISGLY, Disp: , Rfl:    levothyroxine (SYNTHROID) 100 MCG tablet, Take 100 mcg by mouth daily before breakfast., Disp: , Rfl:    loperamide (IMODIUM A-D) 2 MG tablet, Take 1-2 tablets (2-4 mg total) by mouth 4 (four) times daily as needed for diarrhea or loose stools., Disp: 30 tablet, Rfl: 0   LORazepam (ATIVAN) 1 MG tablet, Take 1 mg by mouth 3 (three) times daily as needed for anxiety., Disp: , Rfl:    Omega-3 Fatty Acids (FISH OIL PO), Take by mouth., Disp: , Rfl:    omeprazole (PRILOSEC) 20 MG capsule, Take 20 mg by mouth daily., Disp: , Rfl:    sertraline (ZOLOFT) 100 MG tablet, Take 100 mg by mouth daily., Disp: , Rfl:    carvedilol (COREG) 3.125 MG tablet, Take 3.125 mg by mouth 2 (two) times daily with a meal., Disp: , Rfl:    losartan (COZAAR) 25 MG tablet, Take 25 mg by mouth daily., Disp: , Rfl:   Physical exam:  Vitals:   10/20/20 1107  BP: (!) 96/55  Pulse: 70  Temp: 98.2 F (36.8 C)  TempSrc: Oral  SpO2: 99%  Weight: 118 lb (53.5 kg)   Physical Exam Constitutional:      Appearance: Normal appearance.  HENT:     Head: Normocephalic and atraumatic.  Eyes:     Pupils:  Pupils are equal, round, and reactive to light.  Cardiovascular:     Rate and Rhythm: Normal rate and regular rhythm.     Heart sounds: Normal heart sounds. No murmur heard. Pulmonary:     Effort: Pulmonary effort is normal.     Breath sounds: Normal breath sounds. No wheezing.  Abdominal:     General: Bowel sounds are normal. There is no distension.     Palpations: Abdomen is soft.     Tenderness: There is abdominal tenderness.  Musculoskeletal:        General: Normal range of motion.     Cervical back: Normal range of motion.  Skin:  General: Skin is warm and dry.     Findings: No rash.  Neurological:     Mental Status: She is alert and oriented to person, place, and time.  Psychiatric:        Judgment: Judgment normal.     CMP Latest Ref Rng & Units 10/20/2020  Glucose 70 - 99 mg/dL 120(H)  BUN 8 - 23 mg/dL 16  Creatinine 0.44 - 1.00 mg/dL 0.90  Sodium 135 - 145 mmol/L 134(L)  Potassium 3.5 - 5.1 mmol/L 4.7  Chloride 98 - 111 mmol/L 99  CO2 22 - 32 mmol/L 28  Calcium 8.9 - 10.3 mg/dL 8.8(L)  Total Protein 6.5 - 8.1 g/dL 7.5  Total Bilirubin 0.3 - 1.2 mg/dL 1.1  Alkaline Phos 38 - 126 U/L 76  AST 15 - 41 U/L 28  ALT 0 - 44 U/L 21   CBC Latest Ref Rng & Units 10/20/2020  WBC 4.0 - 10.5 K/uL 10.9(H)  Hemoglobin 12.0 - 15.0 g/dL 12.9  Hematocrit 36.0 - 46.0 % 38.6  Platelets 150 - 400 K/uL 206    No images are attached to the encounter.  CT CHEST ABDOMEN PELVIS W CONTRAST  Result Date: 09/28/2020 CLINICAL DATA:  Colon cancer.  Rising CEA.  Weight loss. EXAM: CT CHEST, ABDOMEN, AND PELVIS WITH CONTRAST TECHNIQUE: Multidetector CT imaging of the chest, abdomen and pelvis was performed following the standard protocol during bolus administration of intravenous contrast. CONTRAST:  40m OMNIPAQUE IOHEXOL 350 MG/ML SOLN COMPARISON:  06/09/2020 FINDINGS: CT CHEST FINDINGS Cardiovascular: The heart size is normal. No substantial pericardial effusion. Mild atherosclerotic  calcification is noted in the wall of the thoracic aorta. 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 Mediastinum/Nodes: No mediastinal lymphadenopathy. There is no hilar lymphadenopathy. The esophagus has normal imaging features. There is no axillary lymphadenopathy. Lungs/Pleura: No suspicious pulmonary nodule or mass. Mild bronchiectasis noted in the lower lungs with scattered areas of architectural distortion/scarring. 3 mm peripheral left lower lobe nodule on 55/3 is unchanged. No focal airspace consolidation. No pleural effusion. Musculoskeletal: No worrisome lytic or sclerotic osseous abnormality. CT ABDOMEN PELVIS FINDINGS Hepatobiliary: Tiny hypodensity in the medial right liver is stable in the interval, too small to characterize but likely benign (image 50/series 2). There is no evidence for gallstones, gallbladder wall thickening, or pericholecystic fluid. No intrahepatic or extrahepatic biliary dilation. Pancreas: No focal mass lesion. No dilatation of the main duct. No intraparenchymal cyst. No peripancreatic edema. Spleen: No splenomegaly. No focal mass lesion. Adrenals/Urinary Tract: No adrenal nodule or mass. Stable right renal cyst. Tiny hypodensity posterior interpolar left kidney also unchanged and while too small to characterize is likely a benign cyst. No evidence for hydroureter. The urinary bladder appears normal for the degree of distention. Stomach/Bowel: Stomach is unremarkable. No gastric wall thickening. No evidence of outlet obstruction. No small bowel wall thickening. No small bowel dilatation. Status post right hemicolectomy Diverticular changes are noted in the left colon without evidence of diverticulitis. Vascular/Lymphatic: There is moderate atherosclerotic calcification of the abdominal aorta without aneurysm. There is no gastrohepatic or hepatoduodenal ligament lymphadenopathy. No retroperitoneal or mesenteric lymphadenopathy.  Lymph nodes seen in the ileocolic mesentery previously are stable to smaller in the interval (image 63/series 2). Reproductive: Calcified fibroid seen in the uterus. There is no adnexal mass. Other: No intraperitoneal free fluid. Musculoskeletal: No worrisome lytic or sclerotic osseous abnormality. IMPRESSION: 1. Stable exam. No new or progressive findings to suggest recurrent or metastatic disease.  2. 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 3. Left colonic diverticulosis without diverticulitis 4. Aortic Atherosclerosis (ICD10-I70.0). Electronically Signed   By: Misty Stanley M.D.   On: 09/28/2020 10:37   MM 3D SCREEN BREAST BILATERAL  Result Date: 10/19/2020 CLINICAL DATA:  Screening. EXAM: DIGITAL SCREENING BILATERAL MAMMOGRAM WITH TOMOSYNTHESIS AND CAD TECHNIQUE: Bilateral screening digital craniocaudal and mediolateral oblique mammograms were obtained. Bilateral screening digital breast tomosynthesis was performed. The images were evaluated with computer-aided detection. COMPARISON:  Previous exam(s). ACR Breast Density Category c: The breast tissue is heterogeneously dense, which may obscure small masses. FINDINGS: There are no findings suspicious for malignancy. IMPRESSION: No mammographic evidence of malignancy. A result letter of this screening mammogram will be mailed directly to the patient. RECOMMENDATION: Screening mammogram in one year. (Code:SM-B-01Y) BI-RADS CATEGORY  1: Negative. Electronically Signed   By: Valentino Saxon M.D.   On: 10/19/2020 17:00    Assessment and plan- Patient is a 80 y.o. female who presents to symptom management for 2-3 episodes of diarrhea intermittently over the past 2 weeks.    History of colon cancer-she is status post hemicolectomy earlier this year.  More recently establish care with Cynthia Mcgrath and found to have rising tumor markers.  CT abdomen/pelvis did not reveal any evidence of progression or residual disease.  She is  currently under surveillance.  Diarrhea-unclear etiology.  No recent antibiotics.  Recommend labs and collect stool to rule out infection.  Lab work shows elevated white blood cell count at 10.9 indicative of infection.  Additionally will get an abdominal x-ray.  We will call patient with results.  Labs from today are fairly unremarkable except for slight bump in white blood cell count.  We will wait for imaging and stool samples to identify treatment options.  CT abdomen/pelvis did show diverticulosis without evidence of diverticulitis.  Denies having diverticulitis in the past. We discussed the need for her to stay hydrated drinking plenty of fluids such as Gatorade or propel.  Disposition-keep scheduled follow-up.  Will call with results.  Visit Diagnosis 1. Diarrhea, unspecified type   2. Malignant neoplasm of ascending colon ([place])   3. Generalized abdominal pain     Patient expressed understanding and was in agreement with this plan. She also understands that She can call clinic at any time with any questions, concerns, or complaints.   I spent 45 minutes dedicated to the care of this patient (face-to-face and non-face-to-face) on the date of the encounter to include what is described in the assessment and plan.  Thank you for allowing me to participate in the care of this very pleasant patient.   Jacquelin Hawking, NP [place]  Cell - [phone] Pager- [phone] 10/20/2020 1:36 PM

## 2020-10-21 ENCOUNTER — Telehealth: Payer: Self-pay | Admitting: Oncology

## 2020-10-21 NOTE — Telephone Encounter (Signed)
Re- Gastroenteritis  Called and discussed with patient results from stool sample, lab work and abdominal x-ray.  X-ray was negative for acute abnormality.  Stool showed astrovirus which is a self-limiting virus.  Typically this requires no treatment.  I spoke at length with patient regarding this diagnosis and that she would need to stay hydrated drinking fluids with electrolytes.  Lab work is stable.  White blood cell count slightly up to 10.9 secondary to active viral infection.  Recommend she call clinic if symptoms do not improve over the next few days.  Faythe Casa, NP 10/21/2020 2:08 PM

## 2020-10-24 ENCOUNTER — Other Ambulatory Visit: Payer: Self-pay

## 2020-10-24 ENCOUNTER — Inpatient Hospital Stay: Payer: Medicare HMO

## 2020-10-24 DIAGNOSIS — R1084 Generalized abdominal pain: Secondary | ICD-10-CM | POA: Diagnosis not present

## 2020-10-24 DIAGNOSIS — R5383 Other fatigue: Secondary | ICD-10-CM | POA: Diagnosis not present

## 2020-10-24 DIAGNOSIS — D500 Iron deficiency anemia secondary to blood loss (chronic): Secondary | ICD-10-CM | POA: Diagnosis not present

## 2020-10-24 DIAGNOSIS — R0602 Shortness of breath: Secondary | ICD-10-CM | POA: Diagnosis not present

## 2020-10-24 DIAGNOSIS — R197 Diarrhea, unspecified: Secondary | ICD-10-CM | POA: Diagnosis not present

## 2020-10-24 DIAGNOSIS — Z23 Encounter for immunization: Secondary | ICD-10-CM

## 2020-10-24 DIAGNOSIS — C182 Malignant neoplasm of ascending colon: Secondary | ICD-10-CM | POA: Diagnosis not present

## 2020-10-24 DIAGNOSIS — D72829 Elevated white blood cell count, unspecified: Secondary | ICD-10-CM | POA: Diagnosis not present

## 2020-10-24 DIAGNOSIS — I700 Atherosclerosis of aorta: Secondary | ICD-10-CM | POA: Diagnosis not present

## 2020-10-24 MED ORDER — INFLUENZA VAC A&B SA ADJ QUAD 0.5 ML IM PRSY
0.5000 mL | PREFILLED_SYRINGE | Freq: Once | INTRAMUSCULAR | Status: AC
  Administered 2020-10-24: 0.5 mL via INTRAMUSCULAR
  Filled 2020-10-24: qty 0.5

## 2020-11-21 DIAGNOSIS — E034 Atrophy of thyroid (acquired): Secondary | ICD-10-CM | POA: Diagnosis not present

## 2020-11-28 DIAGNOSIS — E034 Atrophy of thyroid (acquired): Secondary | ICD-10-CM | POA: Diagnosis not present

## 2020-11-28 DIAGNOSIS — E7800 Pure hypercholesterolemia, unspecified: Secondary | ICD-10-CM | POA: Diagnosis not present

## 2020-11-28 DIAGNOSIS — F32A Depression, unspecified: Secondary | ICD-10-CM | POA: Diagnosis not present

## 2020-11-28 DIAGNOSIS — F411 Generalized anxiety disorder: Secondary | ICD-10-CM | POA: Diagnosis not present

## 2020-11-28 DIAGNOSIS — C182 Malignant neoplasm of ascending colon: Secondary | ICD-10-CM | POA: Diagnosis not present

## 2020-12-13 ENCOUNTER — Other Ambulatory Visit: Payer: Self-pay

## 2020-12-13 ENCOUNTER — Inpatient Hospital Stay: Payer: Medicare HMO | Attending: Oncology

## 2020-12-13 DIAGNOSIS — D500 Iron deficiency anemia secondary to blood loss (chronic): Secondary | ICD-10-CM | POA: Diagnosis not present

## 2020-12-13 DIAGNOSIS — D649 Anemia, unspecified: Secondary | ICD-10-CM | POA: Diagnosis not present

## 2020-12-13 DIAGNOSIS — C182 Malignant neoplasm of ascending colon: Secondary | ICD-10-CM

## 2020-12-13 DIAGNOSIS — K5900 Constipation, unspecified: Secondary | ICD-10-CM | POA: Diagnosis not present

## 2020-12-13 DIAGNOSIS — Z9049 Acquired absence of other specified parts of digestive tract: Secondary | ICD-10-CM | POA: Insufficient documentation

## 2020-12-13 DIAGNOSIS — Z8379 Family history of other diseases of the digestive system: Secondary | ICD-10-CM | POA: Diagnosis not present

## 2020-12-13 DIAGNOSIS — Z801 Family history of malignant neoplasm of trachea, bronchus and lung: Secondary | ICD-10-CM | POA: Diagnosis not present

## 2020-12-13 DIAGNOSIS — Z885 Allergy status to narcotic agent status: Secondary | ICD-10-CM | POA: Insufficient documentation

## 2020-12-13 DIAGNOSIS — I700 Atherosclerosis of aorta: Secondary | ICD-10-CM | POA: Insufficient documentation

## 2020-12-13 DIAGNOSIS — D259 Leiomyoma of uterus, unspecified: Secondary | ICD-10-CM | POA: Diagnosis not present

## 2020-12-13 DIAGNOSIS — K317 Polyp of stomach and duodenum: Secondary | ICD-10-CM | POA: Insufficient documentation

## 2020-12-13 DIAGNOSIS — R970 Elevated carcinoembryonic antigen [CEA]: Secondary | ICD-10-CM | POA: Insufficient documentation

## 2020-12-13 DIAGNOSIS — Z803 Family history of malignant neoplasm of breast: Secondary | ICD-10-CM | POA: Diagnosis not present

## 2020-12-13 DIAGNOSIS — Z808 Family history of malignant neoplasm of other organs or systems: Secondary | ICD-10-CM | POA: Diagnosis not present

## 2020-12-13 DIAGNOSIS — Z836 Family history of other diseases of the respiratory system: Secondary | ICD-10-CM | POA: Diagnosis not present

## 2020-12-13 DIAGNOSIS — J479 Bronchiectasis, uncomplicated: Secondary | ICD-10-CM | POA: Diagnosis not present

## 2020-12-13 DIAGNOSIS — R197 Diarrhea, unspecified: Secondary | ICD-10-CM | POA: Diagnosis not present

## 2020-12-13 DIAGNOSIS — Z79899 Other long term (current) drug therapy: Secondary | ICD-10-CM | POA: Diagnosis not present

## 2020-12-13 LAB — COMPREHENSIVE METABOLIC PANEL
ALT: 13 U/L (ref 0–44)
AST: 19 U/L (ref 15–41)
Albumin: 3.8 g/dL (ref 3.5–5.0)
Alkaline Phosphatase: 59 U/L (ref 38–126)
Anion gap: 9 (ref 5–15)
BUN: 12 mg/dL (ref 8–23)
CO2: 28 mmol/L (ref 22–32)
Calcium: 9 mg/dL (ref 8.9–10.3)
Chloride: 101 mmol/L (ref 98–111)
Creatinine, Ser: 0.76 mg/dL (ref 0.44–1.00)
GFR, Estimated: >60 mL/min (ref >60)
Glucose, Bld: 101 mg/dL — ABNORMAL HIGH (ref 70–99)
Potassium: 4.4 mmol/L (ref 3.5–5.1)
Sodium: 138 mmol/L (ref 135–145)
Total Bilirubin: 0.4 mg/dL (ref 0.3–1.2)
Total Protein: 7.2 g/dL (ref 6.5–8.1)

## 2020-12-13 LAB — CBC WITH DIFFERENTIAL/PLATELET
Abs Immature Granulocytes: 0.02 10*3/uL (ref 0.00–0.07)
Basophils Absolute: 0 10*3/uL (ref 0.0–0.1)
Basophils Relative: 1 %
Eosinophils Absolute: 0.1 10*3/uL (ref 0.0–0.5)
Eosinophils Relative: 2 %
HCT: 38.2 % (ref 36.0–46.0)
Hemoglobin: 12.6 g/dL (ref 12.0–15.0)
Immature Granulocytes: 0 %
Lymphocytes Relative: 34 %
Lymphs Abs: 1.8 10*3/uL (ref 0.7–4.0)
MCH: 30.1 pg (ref 26.0–34.0)
MCHC: 33 g/dL (ref 30.0–36.0)
MCV: 91.4 fL (ref 80.0–100.0)
Monocytes Absolute: 0.5 10*3/uL (ref 0.1–1.0)
Monocytes Relative: 10 %
Neutro Abs: 2.7 10*3/uL (ref 1.7–7.7)
Neutrophils Relative %: 53 %
Platelets: 293 10*3/uL (ref 150–400)
RBC: 4.18 MIL/uL (ref 3.87–5.11)
RDW: 14.3 % (ref 11.5–15.5)
WBC: 5.2 10*3/uL (ref 4.0–10.5)
nRBC: 0 % (ref 0.0–0.2)

## 2020-12-14 ENCOUNTER — Inpatient Hospital Stay: Payer: Medicare HMO | Admitting: Oncology

## 2020-12-14 ENCOUNTER — Telehealth: Payer: Self-pay | Admitting: Oncology

## 2020-12-14 LAB — CEA: CEA: 5.4 ng/mL — ABNORMAL HIGH (ref 0.0–4.7)

## 2020-12-14 NOTE — Telephone Encounter (Signed)
Daughter called to reschedule pt appt for today. Pt was around someone with Covid on yesterday.call back at [phone]

## 2020-12-21 DIAGNOSIS — R399 Unspecified symptoms and signs involving the genitourinary system: Secondary | ICD-10-CM | POA: Diagnosis not present

## 2020-12-27 ENCOUNTER — Encounter: Payer: Self-pay | Admitting: Oncology

## 2020-12-27 ENCOUNTER — Other Ambulatory Visit: Payer: Self-pay

## 2020-12-27 ENCOUNTER — Inpatient Hospital Stay (HOSPITAL_BASED_OUTPATIENT_CLINIC_OR_DEPARTMENT_OTHER): Payer: Medicare HMO | Admitting: Oncology

## 2020-12-27 VITALS — BP 117/76 | HR 71 | Temp 98.2°F | Resp 18 | Wt 121.6 lb

## 2020-12-27 DIAGNOSIS — K5900 Constipation, unspecified: Secondary | ICD-10-CM | POA: Diagnosis not present

## 2020-12-27 DIAGNOSIS — R970 Elevated carcinoembryonic antigen [CEA]: Secondary | ICD-10-CM | POA: Diagnosis not present

## 2020-12-27 DIAGNOSIS — I700 Atherosclerosis of aorta: Secondary | ICD-10-CM | POA: Diagnosis not present

## 2020-12-27 DIAGNOSIS — D500 Iron deficiency anemia secondary to blood loss (chronic): Secondary | ICD-10-CM | POA: Diagnosis not present

## 2020-12-27 DIAGNOSIS — K317 Polyp of stomach and duodenum: Secondary | ICD-10-CM | POA: Diagnosis not present

## 2020-12-27 DIAGNOSIS — C182 Malignant neoplasm of ascending colon: Secondary | ICD-10-CM

## 2020-12-27 DIAGNOSIS — D259 Leiomyoma of uterus, unspecified: Secondary | ICD-10-CM | POA: Diagnosis not present

## 2020-12-27 DIAGNOSIS — D649 Anemia, unspecified: Secondary | ICD-10-CM | POA: Diagnosis not present

## 2020-12-27 DIAGNOSIS — J479 Bronchiectasis, uncomplicated: Secondary | ICD-10-CM | POA: Diagnosis not present

## 2020-12-27 NOTE — Progress Notes (Signed)
Pt here for follow up. She is currently on antibiotic for UTI.

## 2020-12-27 NOTE — Progress Notes (Signed)
Hematology/Oncology Progress note  [phone] [phone]   Patient Care Team: Baxter Hire, MD as PCP - General (Internal Medicine) Earlie Server, MD as Consulting Physician (Hematology and Oncology)  REFERRING PROVIDER: Baxter Hire, MD  CHIEF COMPLAINTS/REASON FOR VISIT:  Stage I right colon cancer  HISTORY OF PRESENTING ILLNESS:   Cynthia Mcgrath is a  80 y.o.  female with PMH listed below was seen in consultation at the request of  Baxter Hire, MD  for evaluation of right colon cancer  01/06/2020-01/07/2020, patient presented with symptomatic anemia, shortness of breath and fatigue with hemoglobin 6.8.  Patient was seen by gastroenterology.  Status post transfusion of 1 unit of PRBC, post transfusion hemoglobin 8.  Iron studies were consistent with iron deficiency. 01/12/2020, patient underwent upper endoscopy and colonoscopy. Upper endoscopy showed small hiatal hernia.  Multiple gastric polyps.  Biopsied. Colonoscopy showed 4 mm polyp in the ascending colon.  Sessile.  Pathology showed fundic gland polyps. Likely malignant tumor at the ileocecal valve biopsied.  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  2 mildly enlarged right lower quadrant mesenteric lymph nodes.  2 small nonspecific liver lesions as described above.  Too small to accurately characterize and most likely represent small cysts.  Sigmoid diverticulosis.  Bronchiectasis approximately 60% luminal stenosis involving proximal portion of the left subclavian artery due to soft plaque formation.  Calcified uterine fibroids.  Aortic atherosclerosis   Preop CEA is 4.8.  Patient is a non-smoker. 02/09/2020, underwent right hemicolectomy-by Dr.Pabon Pathology  confirmed a invasive colorectal adenocarcinoma, incidental tubular adenoma 0.7 cm and submucosal lipoma 2 cm.  24 lymph nodes were examined and 0 were positive.  Negative for LVI, perineural invasion. 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  Excludes the possibility of Lynch syndrome.  Patient was referred to establish care with oncology for further evaluation. Today patient was accompanied by her daughter Cynthia Mcgrath.  Patient reports feeling well.  Denies any abdominal pain, fever, chills, bloody stool. Patient was previously advised to take oral iron supplementation which worsens her heartburn symptoms.  Also has constipation while on iron supplementation.  INTERVAL HISTORY Cynthia Mcgrath is a 80 y.o. female who has above history reviewed by me today presents for follow up visit for  Right colon cancer. During the interval, patient was seen by nurse practitioner in the symptom management clinic for diarrhea. Today patient reports that diarrhea symptom has totally resolved.  No fever, chills, unintentional weight loss.  Denies any blood in the stool. Patient is currently on antibiotics for UTI.   Marland Kitchen Review of Systems  Constitutional:  Negative for appetite change, chills, fatigue and fever.  HENT:   Negative for hearing loss and voice change.   Eyes:  Negative for eye problems.  Respiratory:  Negative for chest tightness and cough.   Cardiovascular:  Negative for chest pain.  Gastrointestinal:  Negative for abdominal distention, abdominal pain and blood in stool.  Endocrine: Negative for hot flashes.  Musculoskeletal:  Negative for arthralgias.  Skin:  Negative for itching and rash.  Neurological:  Negative for extremity weakness.  Hematological:  Negative for adenopathy.  Psychiatric/Behavioral:  Negative for confusion.   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   Sigmoid diverticulosis    Valvular heart disease     SURGICAL HISTORY: Past Surgical History:  Procedure Laterality Date   ACOUSTIC MENINGIOMA RESECTION Right 04/2004   APPENDECTOMY     BREAST EXCISIONAL BIOPSY Bilateral 1980's   NEG   CATARACT EXTRACTION, BILATERAL Bilateral    COLONOSCOPY     COLONOSCOPY WITH PROPOFOL N/A 01/12/2020   Procedure: COLONOSCOPY WITH PROPOFOL;  Surgeon: Lucilla Lame, MD;  Location: [facility] ENDOSCOPY;  Service: Endoscopy;  Laterality: N/A;   ESOPHAGOGASTRODUODENOSCOPY (EGD) WITH PROPOFOL N/A 01/12/2020   Procedure: ESOPHAGOGASTRODUODENOSCOPY (EGD) WITH PROPOFOL;  Surgeon: Lucilla Lame, MD;  Location: [facility] ENDOSCOPY;  Service: Endoscopy;  Laterality: N/A;   HEMORROIDECTOMY     IMPLANTATION VOCAL CORD Right    removal of brain tumor     SHOULDER ARTHROSCOPY Right    rotator cuff repair   THYROID GROWTH REMOVAL  1985    SOCIAL HISTORY: Social History   Socioeconomic History   Marital status: Widowed    Spouse name: Not on file   Number of children: Not on file   Years of education: Not on file   Highest education level: Not on file  Occupational History   Not on file  Tobacco Use   Smoking status: Never   Smokeless tobacco: Never  Vaping Use   Vaping Use: Never used  Substance and Sexual Activity   Alcohol use: Not Currently   Drug use: Never   Sexual activity: Not on file  Other Topics Concern   Not on file  Social History Narrative   Not on file   Social Determinants of Health    Financial Resource Strain: Not on file  Food Insecurity: Not on file  Transportation Needs: Not on file  Physical Activity: Not on file  Stress: Not on file  Social Connections: Not on file  Intimate Partner Violence: Not on file    FAMILY HISTORY: Family History  Problem Relation Age of Onset   Breast cancer Cousin 63   Hernia Mother    Dysphagia Mother    Lung cancer Father    Brain cancer Brother     ALLERGIES:  is allergic to codeine.  MEDICATIONS:  Current Outpatient Medications  Medication Sig Dispense Refill   busPIRone (BUSPAR) 5 MG tablet Take 1 tablet by mouth 2 (two) times daily.     Calcium Carb-Cholecalciferol (CALTRATE 600+D3 PO) Take by mouth.     carvedilol (COREG) 6.25 MG tablet      cephALEXin (KEFLEX) 500 MG capsule Take by mouth.     Fe Bisgly-Vit C-Vit B12-FA 28-60-0.008-0.4 MG CAPS Take 1-3 capsules by mouth daily. THORNE: IRON BISGLY     levothyroxine (SYNTHROID) 100 MCG tablet Take 100 mcg by mouth daily before breakfast.     loperamide (IMODIUM A-D) 2 MG tablet Take 1-2 tablets (2-4 mg total) by mouth 4 (four) times daily as  needed for diarrhea or loose stools. 30 tablet 0   LORazepam (ATIVAN) 1 MG tablet Take 1 mg by mouth 3 (three) times daily as needed for anxiety.     omeprazole (PRILOSEC) 20 MG capsule Take 20 mg by mouth daily.     oxybutynin (DITROPAN-XL) 5 MG 24 hr tablet Take by mouth.     sertraline (ZOLOFT) 100 MG tablet Take 100 mg by mouth daily.     No current facility-administered medications for this visit.     PHYSICAL EXAMINATION: ECOG PERFORMANCE STATUS: 1 - Symptomatic but completely ambulatory Vitals:   12/27/20 1316  BP: 117/76  Pulse: 71  Resp: 18  Temp: 98.2 F (36.8 C)   Filed Weights   12/27/20 1316  Weight: 121 lb 9.6 oz (55.2 kg)    Physical Exam Constitutional:      General: She is not in acute distress.    Comments: Patient walks independently  HENT:     Head: Normocephalic and atraumatic.  Eyes:      General: No scleral icterus. Cardiovascular:     Rate and Rhythm: Normal rate and regular rhythm.     Heart sounds: Normal heart sounds.  Pulmonary:     Effort: Pulmonary effort is normal. No respiratory distress.     Breath sounds: No wheezing.  Abdominal:     General: Bowel sounds are normal. There is no distension.     Palpations: Abdomen is soft.  Musculoskeletal:        General: No deformity. Normal range of motion.     Cervical back: Normal range of motion and neck supple.  Skin:    General: Skin is warm and dry.     Findings: No erythema or rash.  Neurological:     Mental Status: She is alert and oriented to person, place, and time. Mental status is at baseline.     Cranial Nerves: No cranial nerve deficit.     Coordination: Coordination normal.  Psychiatric:        Mood and Affect: Mood normal.    LABORATORY DATA:  I have reviewed the data as listed Lab Results  Component Value Date   WBC 5.2 12/13/2020   HGB 12.6 12/13/2020   HCT 38.2 12/13/2020   MCV 91.4 12/13/2020   PLT 293 12/13/2020   Recent Labs    09/12/20 0858 10/20/20 1046 12/13/20 1108  NA 139 134* 138  K 4.0 4.7 4.4  CL 106 99 101  CO2 26 28 28   GLUCOSE 104* 120* 101*  BUN 12 16 12   CREATININE 0.82 0.90 0.76  CALCIUM 8.7* 8.8* 9.0  GFRNONAA >60 >60 >60  PROT 6.9 7.5 7.2  ALBUMIN 3.9 3.7 3.8  AST 20 28 19   ALT 13 21 13   ALKPHOS 52 76 59  BILITOT 0.7 1.1 0.4    Iron/TIBC/Ferritin/ %Sat    Component Value Date/Time   IRON 81 09/12/2020 0858   TIBC 442 09/12/2020 0858   FERRITIN 11 09/12/2020 0858   IRONPCTSAT 18 09/12/2020 0858      RADIOGRAPHIC STUDIES: I have personally reviewed the radiological images as listed and agreed with the findings in the report. DG Abd 2 Views  Result Date: 10/20/2020 CLINICAL DATA:  Abdominal pain. EXAM: ABDOMEN - 2 VIEW COMPARISON:  CT chest, abdomen, and pelvis dated September 27, 2020. FINDINGS: The bowel gas pattern is normal. There is no  evidence of free air. No radio-opaque calculi or other significant radiographic abnormality is seen. Unchanged calcified fibroid in  the pelvis. IMPRESSION: Negative. Electronically Signed   By: Titus Dubin M.D.   On: 10/20/2020 14:49   MM 3D SCREEN BREAST BILATERAL  Result Date: 10/19/2020 CLINICAL DATA:  Screening. EXAM: DIGITAL SCREENING BILATERAL MAMMOGRAM WITH TOMOSYNTHESIS AND CAD TECHNIQUE: Bilateral screening digital craniocaudal and mediolateral oblique mammograms were obtained. Bilateral screening digital breast tomosynthesis was performed. The images were evaluated with computer-aided detection. COMPARISON:  Previous exam(s). ACR Breast Density Category c: The breast tissue is heterogeneously dense, which may obscure small masses. FINDINGS: There are no findings suspicious for malignancy. IMPRESSION: No mammographic evidence of malignancy. A result letter of this screening mammogram will be mailed directly to the patient. RECOMMENDATION: Screening mammogram in one year. (Code:SM-B-01Y) BI-RADS CATEGORY  1: Negative. Electronically Signed   By: Valentino Saxon M.D.   On: 10/19/2020 17:00      ASSESSMENT & PLAN:  1. Malignant neoplasm of ascending colon (HCC)   2. Elevated CEA    Cancer Staging  Malignant neoplasm of ascending colon [facility]) Staging form: Colon and Rectum, AJCC 8th Edition - Pathologic stage from 03/09/2020: Stage I (pT2, pN0, cM0) - Signed by Earlie Server, MD on 03/09/2020   #Stage I right colon cancer pT2pN0, BRAF positive Status post right hemicolectomy-02/09/2020. Preop CEA 4.7, rising postop CEA 5.4 --> 5.7--> 6.9--> < 0.3 --> 5.4--> 5.5--> 5.4 Rising CEA, ? Cancer recurrence vs chronic inflammation,  Previous CT showed no signs of disease recurrence.  Chronic elevated CEA is likely her baseline. Recommend patient to contact GI Dr.Wohl's office to schedule a repeat colonoscopy 1 year after her surgery.  We will be in February 2023. Recommend follow-up every 6 months  with labs history and physical examination.  Orders Placed This Encounter  Procedures   CBC with Differential/Platelet    Standing Status:   Future    Standing Expiration Date:   12/27/2021   Comprehensive metabolic panel    Standing Status:   Future    Standing Expiration Date:   12/27/2021   CEA    Standing Status:   Future    Standing Expiration Date:   12/27/2021    All questions were answered. The patient knows to call the clinic with any problems questions or concerns.  cc Baxter Hire, MD    Return of visit: 6 months   Earlie Server, MD, PhD  12/27/2020

## 2021-01-03 ENCOUNTER — Ambulatory Visit (INDEPENDENT_AMBULATORY_CARE_PROVIDER_SITE_OTHER): Payer: Medicare HMO | Admitting: Gastroenterology

## 2021-01-03 ENCOUNTER — Encounter: Payer: Self-pay | Admitting: Gastroenterology

## 2021-01-03 VITALS — BP 125/80 | HR 90 | Temp 97.7°F | Wt 120.0 lb

## 2021-01-03 DIAGNOSIS — C182 Malignant neoplasm of ascending colon: Secondary | ICD-10-CM

## 2021-01-03 NOTE — Progress Notes (Signed)
Primary Care Physician: Baxter Hire, MD  Primary Gastroenterologist:  Dr. Lucilla Lame  Chief Complaint  Patient presents with   New Patient (Initial Visit)    Discuss colonoscopy    HPI: Cynthia Mcgrath is a 81 y.o. female here for follow-up after having a colonoscopy by me back in January 2022.  At that time the patient was found to have a fungating mass at the ileocecal valve.  The patient underwent a right hemicolectomy and was done due to the pathology finding of adenocarcinoma.  The patient is now being sent to me for evaluation and questioning whether she should have a repeat colonoscopy at her age.  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     Calcium Carb-Cholecalciferol (CALTRATE 600+D3 PO) Take by mouth.     carvedilol (COREG) 6.25 MG tablet      Fe Bisgly-Vit C-Vit B12-FA 28-60-0.008-0.4 MG CAPS Take 1-3 capsules by mouth daily. THORNE: IRON BISGLY     levothyroxine (SYNTHROID) 100 MCG tablet Take 100 mcg by mouth daily before breakfast.     loperamide (IMODIUM A-D) 2 MG tablet Take 1-2 tablets (2-4 mg total) by mouth 4 (four) times daily as needed for diarrhea or loose stools. 30 tablet 0   LORazepam (ATIVAN) 1 MG tablet Take 1 mg by mouth 3 (three) times daily as needed for anxiety.     omeprazole  (PRILOSEC) 20 MG capsule Take 20 mg by mouth daily.     oxybutynin (DITROPAN-XL) 5 MG 24 hr tablet Take by mouth.     sertraline (ZOLOFT) 100 MG tablet Take 100 mg by mouth daily.     No current facility-administered medications for this visit.    Allergies as of 01/03/2021 - Review Complete 01/03/2021  Allergen Reaction Noted   Codeine Itching 06/08/2017    ROS:  General: Negative for anorexia, weight loss, fever, chills, fatigue, weakness. ENT: Negative for hoarseness, difficulty swallowing , nasal congestion. CV: Negative for chest pain, angina, palpitations, dyspnea on exertion, peripheral edema.  Respiratory: Negative for dyspnea at rest, dyspnea on exertion, cough, sputum, wheezing.  GI: See history of present illness. GU:  Negative for dysuria, hematuria, urinary incontinence, urinary frequency, nocturnal urination.  Endo: Negative for unusual weight change.    Physical Examination:   BP 125/80    Pulse 90    Temp 97.7 F (36.5 C) (Oral)    Wt 120 lb (54.4 kg)    BMI 23.44 kg/m   General: Well-nourished, well-developed in no acute distress.  Eyes: No icterus. Conjunctivae pink. Neuro: Alert and oriented x 3.  Grossly intact. Skin: Warm and dry, no jaundice.   Psych: Alert and cooperative, normal mood and  affect.  Labs:    Imaging Studies: No results found.  Assessment and Plan:   Cynthia Mcgrath is a 81 y.o. y/o female who comes in after having colon cancer last year with resection.  The patient has been doing well and is very hesitant to undergo any further testing.  The patient states that she does not want a repeat colonoscopy.  The patient has been told the risks and benefits of not having a colon examined 1 year after her resection and states she understands it and would hold off on any further procedures at this time.  The patient will contact me if she changes her mind.     Lucilla Lame, MD. Marval Regal    Note: This dictation was prepared with Dragon dictation  along with smaller phrase technology. Any transcriptional errors that result from this process are unintentional.

## 2021-02-21 DIAGNOSIS — E034 Atrophy of thyroid (acquired): Secondary | ICD-10-CM | POA: Diagnosis not present

## 2021-02-28 DIAGNOSIS — Z0001 Encounter for general adult medical examination with abnormal findings: Secondary | ICD-10-CM | POA: Diagnosis not present

## 2021-02-28 DIAGNOSIS — E7800 Pure hypercholesterolemia, unspecified: Secondary | ICD-10-CM | POA: Diagnosis not present

## 2021-02-28 DIAGNOSIS — F32A Depression, unspecified: Secondary | ICD-10-CM | POA: Diagnosis not present

## 2021-02-28 DIAGNOSIS — Z0000 Encounter for general adult medical examination without abnormal findings: Secondary | ICD-10-CM | POA: Diagnosis not present

## 2021-02-28 DIAGNOSIS — F411 Generalized anxiety disorder: Secondary | ICD-10-CM | POA: Diagnosis not present

## 2021-02-28 DIAGNOSIS — E034 Atrophy of thyroid (acquired): Secondary | ICD-10-CM | POA: Diagnosis not present

## 2021-05-04 DIAGNOSIS — I38 Endocarditis, valve unspecified: Secondary | ICD-10-CM | POA: Diagnosis not present

## 2021-05-04 DIAGNOSIS — I428 Other cardiomyopathies: Secondary | ICD-10-CM | POA: Diagnosis not present

## 2021-05-04 DIAGNOSIS — I771 Stricture of artery: Secondary | ICD-10-CM | POA: Diagnosis not present

## 2021-05-30 ENCOUNTER — Inpatient Hospital Stay: Payer: Medicare HMO | Attending: Oncology

## 2021-05-30 ENCOUNTER — Other Ambulatory Visit: Payer: Self-pay

## 2021-05-30 ENCOUNTER — Telehealth: Payer: Medicare HMO | Admitting: *Deleted

## 2021-05-30 DIAGNOSIS — K449 Diaphragmatic hernia without obstruction or gangrene: Secondary | ICD-10-CM | POA: Diagnosis not present

## 2021-05-30 DIAGNOSIS — Z8379 Family history of other diseases of the digestive system: Secondary | ICD-10-CM | POA: Diagnosis not present

## 2021-05-30 DIAGNOSIS — K317 Polyp of stomach and duodenum: Secondary | ICD-10-CM | POA: Insufficient documentation

## 2021-05-30 DIAGNOSIS — Z801 Family history of malignant neoplasm of trachea, bronchus and lung: Secondary | ICD-10-CM | POA: Diagnosis not present

## 2021-05-30 DIAGNOSIS — I700 Atherosclerosis of aorta: Secondary | ICD-10-CM | POA: Diagnosis not present

## 2021-05-30 DIAGNOSIS — Z808 Family history of malignant neoplasm of other organs or systems: Secondary | ICD-10-CM | POA: Diagnosis not present

## 2021-05-30 DIAGNOSIS — C182 Malignant neoplasm of ascending colon: Secondary | ICD-10-CM | POA: Diagnosis not present

## 2021-05-30 DIAGNOSIS — Z9049 Acquired absence of other specified parts of digestive tract: Secondary | ICD-10-CM | POA: Diagnosis not present

## 2021-05-30 DIAGNOSIS — Z885 Allergy status to narcotic agent status: Secondary | ICD-10-CM | POA: Diagnosis not present

## 2021-05-30 DIAGNOSIS — J479 Bronchiectasis, uncomplicated: Secondary | ICD-10-CM | POA: Insufficient documentation

## 2021-05-30 DIAGNOSIS — K921 Melena: Secondary | ICD-10-CM | POA: Insufficient documentation

## 2021-05-30 DIAGNOSIS — K5900 Constipation, unspecified: Secondary | ICD-10-CM | POA: Diagnosis not present

## 2021-05-30 DIAGNOSIS — Z803 Family history of malignant neoplasm of breast: Secondary | ICD-10-CM | POA: Diagnosis not present

## 2021-05-30 DIAGNOSIS — D500 Iron deficiency anemia secondary to blood loss (chronic): Secondary | ICD-10-CM

## 2021-05-30 DIAGNOSIS — Z79899 Other long term (current) drug therapy: Secondary | ICD-10-CM | POA: Insufficient documentation

## 2021-05-30 DIAGNOSIS — Z836 Family history of other diseases of the respiratory system: Secondary | ICD-10-CM | POA: Insufficient documentation

## 2021-05-30 LAB — CBC WITH DIFFERENTIAL/PLATELET
Abs Immature Granulocytes: 0.02 10*3/uL (ref 0.00–0.07)
Basophils Absolute: 0.1 10*3/uL (ref 0.0–0.1)
Basophils Relative: 1 %
Eosinophils Absolute: 0.1 10*3/uL (ref 0.0–0.5)
Eosinophils Relative: 2 %
HCT: 40.2 % (ref 36.0–46.0)
Hemoglobin: 13.2 g/dL (ref 12.0–15.0)
Immature Granulocytes: 0 %
Lymphocytes Relative: 41 %
Lymphs Abs: 2.3 10*3/uL (ref 0.7–4.0)
MCH: 30.3 pg (ref 26.0–34.0)
MCHC: 32.8 g/dL (ref 30.0–36.0)
MCV: 92.2 fL (ref 80.0–100.0)
Monocytes Absolute: 0.6 10*3/uL (ref 0.1–1.0)
Monocytes Relative: 10 %
Neutro Abs: 2.6 10*3/uL (ref 1.7–7.7)
Neutrophils Relative %: 46 %
Platelets: 233 10*3/uL (ref 150–400)
RBC: 4.36 MIL/uL (ref 3.87–5.11)
RDW: 13.1 % (ref 11.5–15.5)
WBC: 5.7 10*3/uL (ref 4.0–10.5)
nRBC: 0 % (ref 0.0–0.2)

## 2021-05-30 LAB — COMPREHENSIVE METABOLIC PANEL
ALT: 12 U/L (ref 0–44)
AST: 22 U/L (ref 15–41)
Albumin: 3.7 g/dL (ref 3.5–5.0)
Alkaline Phosphatase: 46 U/L (ref 38–126)
Anion gap: 5 (ref 5–15)
BUN: 12 mg/dL (ref 8–23)
CO2: 28 mmol/L (ref 22–32)
Calcium: 8.7 mg/dL — ABNORMAL LOW (ref 8.9–10.3)
Chloride: 106 mmol/L (ref 98–111)
Creatinine, Ser: 0.74 mg/dL (ref 0.44–1.00)
GFR, Estimated: >60 mL/min (ref >60)
Glucose, Bld: 98 mg/dL (ref 70–99)
Potassium: 4 mmol/L (ref 3.5–5.1)
Sodium: 139 mmol/L (ref 135–145)
Total Bilirubin: 0.2 mg/dL — ABNORMAL LOW (ref 0.3–1.2)
Total Protein: 6.7 g/dL (ref 6.5–8.1)

## 2021-05-30 LAB — IRON AND TIBC
Iron: 71 ug/dL (ref 28–170)
Saturation Ratios: 20 % (ref 10.4–31.8)
TIBC: 351 ug/dL (ref 250–450)
UIBC: 280 ug/dL

## 2021-05-30 LAB — SAMPLE TO BLOOD BANK

## 2021-05-30 LAB — FERRITIN: Ferritin: 19 ng/mL (ref 11–307)

## 2021-05-30 NOTE — Telephone Encounter (Signed)
Dr. Tasia Catchings will move appt up. Our scheduler will r/s appt for this week.

## 2021-05-30 NOTE — Telephone Encounter (Signed)
Please move patient appt up to labs today and MD tomorrow

## 2021-05-30 NOTE — Telephone Encounter (Signed)
Patient called reporting that she has been having black stools for past 3 months and she feels that she needs to have her stools checked asap as she is concerned and does not want to wait another month for her appointment to be checked. Please advise

## 2021-05-31 ENCOUNTER — Encounter: Payer: Self-pay | Admitting: Oncology

## 2021-05-31 ENCOUNTER — Inpatient Hospital Stay: Payer: Medicare HMO | Admitting: Oncology

## 2021-05-31 VITALS — BP 106/81 | HR 97 | Temp 97.1°F | Wt 118.7 lb

## 2021-05-31 DIAGNOSIS — K921 Melena: Secondary | ICD-10-CM

## 2021-05-31 DIAGNOSIS — D500 Iron deficiency anemia secondary to blood loss (chronic): Secondary | ICD-10-CM | POA: Diagnosis not present

## 2021-05-31 DIAGNOSIS — K449 Diaphragmatic hernia without obstruction or gangrene: Secondary | ICD-10-CM | POA: Diagnosis not present

## 2021-05-31 DIAGNOSIS — K317 Polyp of stomach and duodenum: Secondary | ICD-10-CM | POA: Diagnosis not present

## 2021-05-31 DIAGNOSIS — C182 Malignant neoplasm of ascending colon: Secondary | ICD-10-CM

## 2021-05-31 DIAGNOSIS — I700 Atherosclerosis of aorta: Secondary | ICD-10-CM | POA: Diagnosis not present

## 2021-05-31 DIAGNOSIS — J479 Bronchiectasis, uncomplicated: Secondary | ICD-10-CM | POA: Diagnosis not present

## 2021-05-31 DIAGNOSIS — Z79899 Other long term (current) drug therapy: Secondary | ICD-10-CM | POA: Diagnosis not present

## 2021-05-31 DIAGNOSIS — K5900 Constipation, unspecified: Secondary | ICD-10-CM | POA: Diagnosis not present

## 2021-05-31 LAB — OCCULT BLOOD X 1 CARD TO LAB, STOOL: Fecal Occult Bld: POSITIVE — AB

## 2021-05-31 LAB — CEA: CEA: 3.5 ng/mL (ref 0.0–4.7)

## 2021-05-31 NOTE — Progress Notes (Signed)
Hematology/Oncology Progress note  [phone] [phone]   Patient Care Team: Baxter Hire, MD as PCP - General (Internal Medicine) Earlie Server, MD as Consulting Physician (Hematology and Oncology)  REFERRING PROVIDER: Baxter Hire, MD  CHIEF COMPLAINTS/REASON FOR VISIT:  Stage I right colon cancer  HISTORY OF PRESENTING ILLNESS:   ELLEANOR Mcgrath is a  81 y.o.  female with PMH listed below was seen in consultation at the request of  Baxter Hire, MD  for evaluation of right colon cancer  01/06/2020-01/07/2020, patient presented with symptomatic anemia, shortness of breath and fatigue with hemoglobin 6.8.  Patient was seen by gastroenterology.  Status post transfusion of 1 unit of PRBC, post transfusion hemoglobin 8.  Iron studies were consistent with iron deficiency. 01/12/2020, patient underwent upper endoscopy and colonoscopy. Upper endoscopy showed small hiatal hernia.  Multiple gastric polyps.  Biopsied. Colonoscopy showed 4 mm polyp in the ascending colon.  Sessile.  Pathology showed fundic gland polyps. Likely malignant tumor at the ileocecal valve biopsied.  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  2 mildly enlarged right lower quadrant mesenteric lymph nodes.  2 small nonspecific liver lesions as described above.  Too small to accurately characterize and most likely represent small cysts.  Sigmoid diverticulosis.  Bronchiectasis approximately 60% luminal stenosis involving proximal portion of the left subclavian artery due to soft plaque formation.  Calcified uterine fibroids.  Aortic atherosclerosis   Preop CEA is 4.8.  Patient is a non-smoker. 02/09/2020, underwent right hemicolectomy-by Dr.Pabon Pathology  confirmed a invasive colorectal adenocarcinoma, incidental tubular adenoma 0.7 cm and submucosal lipoma 2 cm.  24 lymph nodes were examined and 0 were positive.  Negative for LVI, perineural invasion. 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  Excludes the possibility of Lynch syndrome.  Patient was referred to establish care with oncology for further evaluation. Today patient was accompanied by her daughter [name].  Patient reports feeling well.  Denies any abdominal pain, fever, chills, bloody stool. Patient was previously advised to take oral iron supplementation which worsens her heartburn symptoms.  Also has constipation while on iron supplementation.  INTERVAL HISTORY Cynthia Mcgrath is a 81 y.o. female who has above history reviewed by me today presents for acute visit for dark stool, history of stage I right colon cancer, BRAF mutation positive..  During interval, patient was seen by Dr. Allen Norris and she declined 1 year colonoscopy. Patient was accompanied by daughter today She reports history of 1 to 2 months of dark stool.  Patient has been on oral iron supplementation chronically.  She did not notice black stool previously.  She attributes to eating too much of [place] recently. She denies nausea vomiting diarrhea, abdominal pain.  Appetite is good.  She denies significant weight loss unintentionally. . Review of Systems  Constitutional:  Negative for appetite change, chills, fatigue and fever.  HENT:   Negative for hearing loss and voice change.   Eyes:  Negative for eye problems.  Respiratory:  Negative for chest tightness and cough.   Cardiovascular:  Negative for chest pain.  Gastrointestinal:  Negative for abdominal distention, abdominal pain and blood in stool.       Dark stool  Endocrine: Negative  for hot flashes.  Musculoskeletal:  Negative for arthralgias.  Skin:  Negative for itching and rash.   Neurological:  Negative for extremity weakness.  Hematological:  Negative for adenopathy.  Psychiatric/Behavioral:  Negative for confusion.    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   Sigmoid diverticulosis    Valvular heart disease     SURGICAL HISTORY: Past Surgical History:  Procedure Laterality Date   ACOUSTIC MENINGIOMA RESECTION Right 04/2004   APPENDECTOMY     BREAST EXCISIONAL BIOPSY Bilateral 1980's   NEG   CATARACT EXTRACTION, BILATERAL Bilateral    COLONOSCOPY     COLONOSCOPY WITH PROPOFOL N/A 01/12/2020   Procedure: COLONOSCOPY WITH PROPOFOL;  Surgeon: Lucilla Lame, MD;  Location: [facility] ENDOSCOPY;  Service: Endoscopy;  Laterality: N/A;   ESOPHAGOGASTRODUODENOSCOPY (EGD) WITH PROPOFOL N/A 01/12/2020   Procedure: ESOPHAGOGASTRODUODENOSCOPY (EGD) WITH PROPOFOL;  Surgeon: Lucilla Lame, MD;  Location: [facility] ENDOSCOPY;  Service: Endoscopy;  Laterality: N/A;   HEMORROIDECTOMY     IMPLANTATION VOCAL CORD Right    removal of brain tumor     SHOULDER ARTHROSCOPY Right    rotator cuff repair   THYROID GROWTH REMOVAL  1985    SOCIAL HISTORY: Social History   Socioeconomic History   Marital status: Widowed    Spouse name: Not on file   Number of children: Not on file   Years of education: Not on file   Highest education level: Not on file  Occupational History   Not on file  Tobacco Use   Smoking status: Never   Smokeless tobacco: Never  Vaping Use   Vaping Use: Never used   Substance and Sexual Activity   Alcohol use: Not Currently   Drug use: Never   Sexual activity: Not on file  Other Topics Concern   Not on file  Social History Narrative   Not on file   Social Determinants of Health   Financial Resource Strain: Not on file  Food Insecurity: Not on file  Transportation Needs: Not on file  Physical Activity: Not on file  Stress: Not on file  Social Connections: Not on file  Intimate Partner Violence: Not on file    FAMILY HISTORY: Family History  Problem Relation Age of Onset   Breast cancer Cousin 39   Hernia Mother    Dysphagia Mother    Lung cancer Father    Brain cancer Brother     ALLERGIES:  is allergic to codeine.  MEDICATIONS:  Current Outpatient Medications  Medication Sig Dispense Refill   busPIRone (BUSPAR) 5 MG tablet Take 1 tablet by mouth 2 (two) times daily.     Calcium Carb-Cholecalciferol (CALTRATE 600+D3 PO) Take by mouth.     carvedilol (COREG) 6.25 MG tablet      Fe Bisgly-Vit C-Vit B12-FA 28-60-0.008-0.4 MG CAPS Take 1-3 capsules by mouth daily. THORNE: IRON BISGLY     levothyroxine (SYNTHROID) 100 MCG  tablet Take 100 mcg by mouth daily before breakfast.     loperamide (IMODIUM A-D) 2 MG tablet Take 1-2 tablets (2-4 mg total) by mouth 4 (four) times daily as needed for diarrhea or loose stools. 30 tablet 0   LORazepam (ATIVAN) 1 MG tablet Take 1 mg by mouth 3 (three) times daily as needed for anxiety.     omeprazole (PRILOSEC) 20 MG capsule Take 20 mg by mouth daily.     oxybutynin (DITROPAN-XL) 5 MG 24 hr tablet Take by mouth.     sertraline (ZOLOFT) 100 MG tablet Take 100 mg by mouth daily.     No current facility-administered medications for this visit.     PHYSICAL EXAMINATION: ECOG PERFORMANCE STATUS: 1 - Symptomatic but completely ambulatory Vitals:   05/31/21 1340  BP: 106/81  Pulse: 97  Temp: (!) 97.1 F (36.2 C)   Filed Weights   05/31/21 1340  Weight: 118 lb 11.2 oz (53.8 kg)    Physical  Exam Constitutional:      General: She is not in acute distress.    Comments: Patient walks independently  HENT:     Head: Normocephalic and atraumatic.  Eyes:     General: No scleral icterus. Cardiovascular:     Rate and Rhythm: Normal rate and regular rhythm.     Heart sounds: Normal heart sounds.  Pulmonary:     Effort: Pulmonary effort is normal. No respiratory distress.     Breath sounds: No wheezing.  Abdominal:     General: Bowel sounds are normal. There is no distension.     Palpations: Abdomen is soft.  Musculoskeletal:        General: No deformity. Normal range of motion.     Cervical back: Normal range of motion and neck supple.  Skin:    General: Skin is warm and dry.     Findings: No erythema or rash.  Neurological:     Mental Status: She is alert and oriented to person, place, and time. Mental status is at baseline.     Cranial Nerves: No cranial nerve deficit.     Coordination: Coordination normal.  Psychiatric:        Mood and Affect: Mood normal.    LABORATORY DATA:  I have reviewed the data as listed Lab Results  Component Value Date   WBC 5.7 05/30/2021   HGB 13.2 05/30/2021   HCT 40.2 05/30/2021   MCV 92.2 05/30/2021   PLT 233 05/30/2021   Recent Labs    10/20/20 1046 12/13/20 1108 05/30/21 1318  NA 134* 138 139  K 4.7 4.4 4.0  CL 99 101 106  CO2 _0 GLUCOSE 120* 101* 98  BUN _1 CREATININE 0.90 0.76 0.74  CALCIUM 8.8* 9.0 8.7*  GFRNONAA >60 >60 >60  PROT 7.5 7.2 6.7  ALBUMIN 3.7 3.8 3.7  AST _2 ALT _3 ALKPHOS 76 59 46  BILITOT 1.1 0.4 0.2*    Iron/TIBC/Ferritin/ %Sat    Component Value Date/Time   IRON 71 05/30/2021 1318   TIBC 351 05/30/2021 1318   FERRITIN 19 05/30/2021 1318   IRONPCTSAT 20 05/30/2021 1318      RADIOGRAPHIC STUDIES: I have personally reviewed the radiological images as listed and agreed with the findings in the report. No results found.     ASSESSMENT & PLAN:  1.  Malignant neoplasm of ascending colon ([place])   2. Iron deficiency anemia due to chronic  blood loss   3. Black stools    Cancer Staging  Malignant neoplasm of ascending colon [facility]) Staging form: Colon and Rectum, AJCC 8th Edition - Pathologic stage from 03/09/2020: Stage I (pT2, pN0, cM0) - Signed by Earlie Server, MD on 03/09/2020   #Stage I right colon cancer pT2pN0, BRAF positive Status post right hemicolectomy-02/09/2020. Preop CEA 4.7, rising postop CEA 5.4 --> 5.7--> 6.9--> < 0.3 --> 5.4--> 5.5--> 5.4--> 3.5. Dark stool,?  Melena. Discussed with the patient about recommendation colonoscopy surveillance. Check Stool occult.  Patient is reluctant and prefers not to proceed with colonoscopy again at this point.  She would like to wait for the stool test.  She understands that stool testing is not sensitive enough and the negative testing cannot rule out bleeding. She knows to contact Dr. Dorothey Baseman office if she decides to proceed with colonoscopy for further evaluation.  Blood work results were reviewed and discussed with patient.  She has stable hemoglobin with no significant iron deficiency.  Orders Placed This Encounter  Procedures   Occult blood card to lab, stool    Standing Status:   Future    Number of Occurrences:   1    Standing Expiration Date:   06/01/2022   CBC with Differential/Platelet    Standing Status:   Future    Standing Expiration Date:   06/01/2022   Comprehensive metabolic panel    Standing Status:   Future    Standing Expiration Date:   05/31/2022   Iron and TIBC    Standing Status:   Future    Standing Expiration Date:   06/01/2022   Ferritin    Standing Status:   Future    Standing Expiration Date:   06/01/2022   CEA    Standing Status:   Future    Standing Expiration Date:   05/31/2022    All questions were answered. The patient knows to call the clinic with any problems questions or concerns.  cc Baxter Hire, MD    Return of visit: 6 months   Earlie Server,  MD, PhD  05/31/2021

## 2021-06-01 ENCOUNTER — Encounter: Payer: Self-pay | Admitting: Oncology

## 2021-06-01 ENCOUNTER — Telehealth: Payer: Self-pay | Admitting: *Deleted

## 2021-06-01 NOTE — Telephone Encounter (Signed)
Please advise 

## 2021-06-01 NOTE — Telephone Encounter (Signed)
Daughter called stating that the stool results are back and are positive and she I asking what needs to be done now and for a return call to discuss this.  Occult blood card to lab, stool Order: [id number] Status: Final result    Visible to patient: Yes (seen)    Next appt: 11/30/2021 at 01:00 PM in Oncology ([facility])    Dx: Malignant neoplasm of ascending colon...    0 Result Notes     Component Ref Range & Units 2 d ago  Fecal Occult Bld NEGATIVE POSITIVE Abnormal    Comment: Performed at [facility], [address] [place], [place] [address]  Resulting Agency  [facility]         Specimen Collected: 05/30/21 12:45 Last Resulted: 05/31/21 17:10

## 2021-06-02 NOTE — Telephone Encounter (Signed)
Communicated through [employer]

## 2021-06-21 DIAGNOSIS — E034 Atrophy of thyroid (acquired): Secondary | ICD-10-CM | POA: Diagnosis not present

## 2021-06-26 ENCOUNTER — Other Ambulatory Visit: Payer: Medicare HMO

## 2021-06-27 ENCOUNTER — Ambulatory Visit: Payer: Medicare HMO | Admitting: Oncology

## 2021-07-05 DIAGNOSIS — F32A Depression, unspecified: Secondary | ICD-10-CM | POA: Diagnosis not present

## 2021-07-05 DIAGNOSIS — E7800 Pure hypercholesterolemia, unspecified: Secondary | ICD-10-CM | POA: Diagnosis not present

## 2021-07-05 DIAGNOSIS — E034 Atrophy of thyroid (acquired): Secondary | ICD-10-CM | POA: Diagnosis not present

## 2021-07-05 DIAGNOSIS — R413 Other amnesia: Secondary | ICD-10-CM | POA: Diagnosis not present

## 2021-07-05 DIAGNOSIS — F411 Generalized anxiety disorder: Secondary | ICD-10-CM | POA: Diagnosis not present

## 2021-08-15 DIAGNOSIS — F339 Major depressive disorder, recurrent, unspecified: Secondary | ICD-10-CM | POA: Diagnosis not present

## 2021-08-15 DIAGNOSIS — R413 Other amnesia: Secondary | ICD-10-CM | POA: Diagnosis not present

## 2021-08-15 DIAGNOSIS — E538 Deficiency of other specified B group vitamins: Secondary | ICD-10-CM | POA: Diagnosis not present

## 2021-08-15 DIAGNOSIS — G3184 Mild cognitive impairment, so stated: Secondary | ICD-10-CM | POA: Diagnosis not present

## 2021-08-15 DIAGNOSIS — Z7689 Persons encountering health services in other specified circumstances: Secondary | ICD-10-CM | POA: Diagnosis not present

## 2021-08-30 ENCOUNTER — Ambulatory Visit: Payer: Medicare HMO | Admitting: Gastroenterology

## 2021-11-02 DIAGNOSIS — F339 Major depressive disorder, recurrent, unspecified: Secondary | ICD-10-CM | POA: Diagnosis not present

## 2021-11-02 DIAGNOSIS — E538 Deficiency of other specified B group vitamins: Secondary | ICD-10-CM | POA: Diagnosis not present

## 2021-11-02 DIAGNOSIS — E034 Atrophy of thyroid (acquired): Secondary | ICD-10-CM | POA: Diagnosis not present

## 2021-11-02 DIAGNOSIS — Z7689 Persons encountering health services in other specified circumstances: Secondary | ICD-10-CM | POA: Diagnosis not present

## 2021-11-02 DIAGNOSIS — G3184 Mild cognitive impairment, so stated: Secondary | ICD-10-CM | POA: Diagnosis not present

## 2021-11-06 DIAGNOSIS — D509 Iron deficiency anemia, unspecified: Secondary | ICD-10-CM | POA: Diagnosis not present

## 2021-11-06 DIAGNOSIS — R195 Other fecal abnormalities: Secondary | ICD-10-CM | POA: Diagnosis not present

## 2021-11-06 DIAGNOSIS — F32A Depression, unspecified: Secondary | ICD-10-CM | POA: Diagnosis not present

## 2021-11-06 DIAGNOSIS — E034 Atrophy of thyroid (acquired): Secondary | ICD-10-CM | POA: Diagnosis not present

## 2021-11-08 ENCOUNTER — Telehealth: Payer: Self-pay

## 2021-11-08 NOTE — Telephone Encounter (Signed)
Daughter Cynthia Mcgrath called to report pt has Hematest positive stools with PCP nd she wanted to know if pt needed to just have a colonoscopy to evaluate due to the pt's Hx or does she need an office visit....  Please advise

## 2021-11-09 ENCOUNTER — Telehealth: Payer: Self-pay

## 2021-11-09 ENCOUNTER — Other Ambulatory Visit: Payer: Self-pay

## 2021-11-09 DIAGNOSIS — C182 Malignant neoplasm of ascending colon: Secondary | ICD-10-CM

## 2021-11-09 MED ORDER — CLENPIQ 10-3.5-12 MG-GM -GM/160ML PO SOLN
1.0000 | Freq: Once | ORAL | 0 refills | Status: AC
Start: 2021-11-09 — End: 2021-11-09

## 2021-11-09 NOTE — Telephone Encounter (Signed)
Clearance faxed to Dr Bethanne Ginger office as an urgent request

## 2021-11-09 NOTE — Addendum Note (Signed)
Addended by: Lurlean Nanny on: 11/09/2021 01:58 PM   Modules accepted: Orders

## 2021-11-10 LAB — METHYLMALONIC ACID, SERUM: Methylmalonic Acid, Quantitative: 149 nmol/L (ref 0–378)

## 2021-11-10 NOTE — Telephone Encounter (Signed)
Clearance faxed x 2

## 2021-11-15 ENCOUNTER — Encounter: Payer: Self-pay | Admitting: Gastroenterology

## 2021-11-16 ENCOUNTER — Ambulatory Visit: Payer: Medicare HMO | Admitting: Certified Registered"

## 2021-11-16 ENCOUNTER — Encounter
Admission: RE | Disposition: A | Discharge status: Home / Self Care | Payer: Self-pay | Source: Home / Self Care | Attending: Gastroenterology

## 2021-11-16 ENCOUNTER — Ambulatory Visit
Admission: RE | Admit: 2021-11-16 | Discharge: 2021-11-16 | Disposition: A | Discharge status: Home / Self Care | Payer: Medicare HMO | Attending: Gastroenterology | Admitting: Gastroenterology

## 2021-11-16 DIAGNOSIS — Z08 Encounter for follow-up examination after completed treatment for malignant neoplasm: Secondary | ICD-10-CM | POA: Insufficient documentation

## 2021-11-16 DIAGNOSIS — Z85038 Personal history of other malignant neoplasm of large intestine: Secondary | ICD-10-CM | POA: Diagnosis not present

## 2021-11-16 DIAGNOSIS — K640 First degree hemorrhoids: Secondary | ICD-10-CM | POA: Diagnosis not present

## 2021-11-16 DIAGNOSIS — K219 Gastro-esophageal reflux disease without esophagitis: Secondary | ICD-10-CM | POA: Insufficient documentation

## 2021-11-16 DIAGNOSIS — K5730 Diverticulosis of large intestine without perforation or abscess without bleeding: Secondary | ICD-10-CM | POA: Insufficient documentation

## 2021-11-16 DIAGNOSIS — Z980 Intestinal bypass and anastomosis status: Secondary | ICD-10-CM | POA: Diagnosis not present

## 2021-11-16 DIAGNOSIS — F32A Depression, unspecified: Secondary | ICD-10-CM | POA: Insufficient documentation

## 2021-11-16 DIAGNOSIS — I110 Hypertensive heart disease with heart failure: Secondary | ICD-10-CM | POA: Diagnosis not present

## 2021-11-16 DIAGNOSIS — I509 Heart failure, unspecified: Secondary | ICD-10-CM | POA: Diagnosis not present

## 2021-11-16 DIAGNOSIS — I5022 Chronic systolic (congestive) heart failure: Secondary | ICD-10-CM | POA: Diagnosis not present

## 2021-11-16 DIAGNOSIS — F419 Anxiety disorder, unspecified: Secondary | ICD-10-CM | POA: Diagnosis not present

## 2021-11-16 DIAGNOSIS — C182 Malignant neoplasm of ascending colon: Secondary | ICD-10-CM

## 2021-11-16 DIAGNOSIS — E039 Hypothyroidism, unspecified: Secondary | ICD-10-CM | POA: Diagnosis not present

## 2021-11-16 HISTORY — PX: COLONOSCOPY WITH PROPOFOL: SHX5780

## 2021-11-16 SURGERY — COLONOSCOPY WITH PROPOFOL
Anesthesia: General

## 2021-11-16 MED ORDER — SODIUM CHLORIDE 0.9 % IV SOLN: INTRAVENOUS | Status: DC

## 2021-11-16 MED ORDER — PROPOFOL 500 MG/50ML IV EMUL
INTRAVENOUS | Status: DC | PRN
  Administered 2021-11-16: 150 ug/kg/min via INTRAVENOUS

## 2021-11-16 MED ORDER — PROPOFOL 10 MG/ML IV BOLUS
INTRAVENOUS | Status: DC | PRN
  Administered 2021-11-16: 30 mg via INTRAVENOUS
  Administered 2021-11-16: 70 mg via INTRAVENOUS

## 2021-11-16 MED ORDER — LIDOCAINE HCL (CARDIAC) PF 100 MG/5ML IV SOSY
PREFILLED_SYRINGE | INTRAVENOUS | Status: DC | PRN
  Administered 2021-11-16: 50 mg via INTRAVENOUS

## 2021-11-16 NOTE — Anesthesia Postprocedure Evaluation (Signed)
Anesthesia Post Note  Patient: Cynthia Mcgrath  Procedure(s) Performed: COLONOSCOPY WITH PROPOFOL  Patient location during evaluation: Endoscopy Anesthesia Type: General Level of consciousness: awake and alert Pain management: pain level controlled Vital Signs Assessment: post-procedure vital signs reviewed and stable Respiratory status: spontaneous breathing, nonlabored ventilation, respiratory function stable and patient connected to nasal cannula oxygen Cardiovascular status: blood pressure returned to baseline and stable Postop Assessment: no apparent nausea or vomiting Anesthetic complications: no  No notable events documented.   Last Vitals:  Vitals:   11/16/21 0856 11/16/21 1015  BP: 136/86 (!) 123/55  Pulse: 100 99  Resp: 18 20  Temp: 36.4 C   SpO2: 97% 99%    Last Pain:  Vitals:   11/16/21 1015  TempSrc:   PainSc: Asleep                 Ilene Qua

## 2021-11-16 NOTE — Op Note (Signed)
[facility] Gastroenterology Patient Name: Cynthia Mcgrath Procedure Date: 11/16/2021 9:53 AM MRN: [id number] Account #: [id number] Date of Birth: 1940-03-12 Admit Type: Outpatient Age: 81 Room: [facility] ENDO ROOM 4 Gender: Female Note Status: Finalized Instrument Name: Jasper Riling [id number] Procedure:             Colonoscopy Indications:           Personal history of malignant neoplasm of the colon Providers:             Lucilla Lame MD, MD Referring MD:          Andres Labrum, MD (Referring MD) Medicines:             Propofol per Anesthesia Complications:         No immediate complications. Procedure:             Pre-Anesthesia Assessment:                        - Prior to the procedure, a History and Physical was                         performed, and patient medications and allergies were                         reviewed. The patient's tolerance of previous                         anesthesia was also reviewed. The risks and benefits                         of the procedure and the sedation options and risks                         were discussed with the patient. All questions were                         answered, and informed consent was obtained. Prior                         Anticoagulants: The patient has taken no anticoagulant                         or antiplatelet agents. ASA Grade Assessment: II - A                         patient with mild systemic disease. After reviewing                         the risks and benefits, the patient was deemed in                         satisfactory condition to undergo the procedure.                        After obtaining informed consent, the colonoscope was                         passed under direct vision. Throughout the procedure,  the patient's blood pressure, pulse, and oxygen                         saturations were monitored continuously. The                         Colonoscope was  introduced through the anus and                         advanced to the the ileocolonic anastomosis. The                         colonoscopy was performed without difficulty. The                         patient tolerated the procedure well. The quality of                         the bowel preparation was excellent. Findings:      The perianal and digital rectal examinations were normal.      There was evidence of a prior end-to-side ileo-colonic anastomosis in       the transverse colon. This was patent and was characterized by an intact       staple line. Removal of a staple was accomplished with a regular forceps.      Multiple small-mouthed diverticula were found in the sigmoid colon.      Non-bleeding internal hemorrhoids were found during retroflexion. The       hemorrhoids were Grade I (internal hemorrhoids that do not prolapse). Impression:            - Patent end-to-side ileo-colonic anastomosis,                         characterized by an intact staple line.                        - Diverticulosis in the sigmoid colon.                        - Non-bleeding internal hemorrhoids. Recommendation:        - Discharge patient to home.                        - Resume previous diet.                        - Continue present medications. Procedure Code(s):     --- Professional ---                        [phone], Colonoscopy, flexible; with removal of foreign                         body(s) Diagnosis Code(s):     --- Professional ---                        N02.725, Personal history of other malignant neoplasm                         of large intestine CPT copyright  2022 American Medical Association. All rights reserved. The codes documented in this report are preliminary and upon coder review may  be revised to meet current compliance requirements. Lucilla Lame MD, MD 11/16/2021 10:14:31 AM This report has been signed electronically. Number of Addenda: 0 Note Initiated On: 11/16/2021  9:53 AM Scope Withdrawal Time: 0 hours 6 minutes 28 seconds  Total Procedure Duration: 0 hours 12 minutes 3 seconds  Estimated Blood Loss:  Estimated blood loss: none.      [facility]

## 2021-11-16 NOTE — Anesthesia Procedure Notes (Signed)
Procedure Name: MAC Date/Time: 11/16/2021 9:56 AM  Performed by: Biagio Borg, CRNAPre-anesthesia Checklist: Patient identified, Emergency Drugs available, Suction available, Patient being monitored and Timeout performed Patient Re-evaluated:Patient Re-evaluated prior to induction Oxygen Delivery Method: Nasal cannula Induction Type: IV induction Placement Confirmation: positive ETCO2 and CO2 detector

## 2021-11-16 NOTE — Transfer of Care (Signed)
Immediate Anesthesia Transfer of Care Note  Patient: Cynthia Mcgrath  Procedure(s) Performed: COLONOSCOPY WITH PROPOFOL  Patient Location: PACU  Anesthesia Type:General  Level of Consciousness: drowsy  Airway & Oxygen Therapy: Patient Spontanous Breathing  Post-op Assessment: Report given to RN  Post vital signs: Reviewed and stable  Last Vitals:  Vitals Value Taken Time  BP 123/55 11/16/21 1015  Temp    Pulse 99 11/16/21 1015  Resp 20 11/16/21 1015  SpO2 99 % 11/16/21 1015    Last Pain:  Vitals:   11/16/21 1015  TempSrc:   PainSc: Asleep         Complications: No notable events documented.

## 2021-11-16 NOTE — Anesthesia Preprocedure Evaluation (Signed)
Anesthesia Evaluation  Patient identified by MRN, date of birth, ID band Patient awake    Reviewed: Allergy & Precautions, NPO status , Patient's Chart, lab work & pertinent test results  Airway Mallampati: II  TM Distance: >3 FB Neck ROM: full    Dental  (+) Chipped   Pulmonary neg pulmonary ROS   Pulmonary exam normal        Cardiovascular hypertension, On Medications negative cardio ROS Normal cardiovascular exam     Neuro/Psych  PSYCHIATRIC DISORDERS Anxiety Depression    negative neurological ROS     GI/Hepatic Neg liver ROS,GERD  ,,  Endo/Other  Hypothyroidism    Renal/GU negative Renal ROS  negative genitourinary   Musculoskeletal   Abdominal   Peds  Hematology negative hematology ROS (+)   Anesthesia Other Findings Past Medical History: No date: Anxiety No date: Aortic atherosclerosis (HCC) No date: Brain tumor ([place]) No date: Breast fibrocystic disorder No date: Chronic systolic heart failure (HCC) No date: Colon cancer ([place]) No date: Depression No date: GERD (gastroesophageal reflux disease) No date: HTN (hypertension) No date: Hyperlipidemia No date: Hypothyroidism No date: IDA (iron deficiency anemia) No date: Idiopathic cardiomyopathy (HCC) No date: Meningioma (HCC)     Comment:  right acoustic No date: Osteoporosis, post-menopausal No date: 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 No date: Sigmoid diverticulosis No date: Valvular heart disease  Past Surgical History: 04/2004: ACOUSTIC MENINGIOMA RESECTION; Right No date: APPENDECTOMY 1980's: BREAST EXCISIONAL BIOPSY; Bilateral     Comment:  NEG No date: BREAST SURGERY No date: CATARACT EXTRACTION, BILATERAL; Bilateral No date: COLONOSCOPY 01/12/2020: COLONOSCOPY WITH PROPOFOL; N/A     Comment:  Procedure: COLONOSCOPY WITH PROPOFOL;  Surgeon: Lucilla Lame, MD;  Location: [facility] ENDOSCOPY;   Service:               Endoscopy;  Laterality: N/A; 01/12/2020: ESOPHAGOGASTRODUODENOSCOPY (EGD) WITH PROPOFOL; N/A     Comment:  Procedure: ESOPHAGOGASTRODUODENOSCOPY (EGD) WITH               PROPOFOL;  Surgeon: Lucilla Lame, MD;  Location: [facility]               ENDOSCOPY;  Service: Endoscopy;  Laterality: N/A; No date: EYE SURGERY No date: HEMORROIDECTOMY No date: IMPLANTATION VOCAL CORD; Right No date: removal of brain tumor No date: SHOULDER ARTHROSCOPY; Right     Comment:  rotator cuff repair 1985: THYROID GROWTH REMOVAL  BMI    Body Mass Index: 21.43 kg/m      Reproductive/Obstetrics negative OB ROS                             Anesthesia Physical Anesthesia Plan  ASA: 2  Anesthesia Plan: General   Post-op Pain Management: Minimal or no pain anticipated   Induction: Intravenous  PONV Risk Score and Plan: Propofol infusion and TIVA  Airway Management Planned: Natural Airway and Nasal Cannula  Additional Equipment:   Intra-op Plan:   Post-operative Plan:   Informed Consent: I have reviewed the patients History and Physical, chart, labs and discussed the procedure including the risks, benefits and alternatives for the proposed anesthesia with the patient or authorized representative who has indicated his/her understanding and acceptance.     Dental Advisory Given  Plan Discussed with: Anesthesiologist, CRNA and Surgeon  Anesthesia Plan Comments: (Patient  consented for risks of anesthesia including but not limited to:  - adverse reactions to medications - risk of airway placement if required - damage to eyes, teeth, lips or other oral mucosa - nerve damage due to positioning  - sore throat or hoarseness - Damage to heart, brain, nerves, lungs, other parts of body or loss of life  Patient voiced understanding.)       Anesthesia Quick Evaluation

## 2021-11-16 NOTE — H&P (Signed)
Lucilla Lame, MD [facility] [address] [place], [place] [address] Phone:[phone] Fax : [phone]  Primary Care Physician:  Baxter Hire, MD Primary Gastroenterologist:  Dr. Allen Norris  Pre-Procedure History & Physical: HPI:  Cynthia Mcgrath is a 81 y.o. female is here for an colonoscopy.   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   Sigmoid diverticulosis    Valvular heart disease     Past Surgical History:  Procedure Laterality Date   ACOUSTIC MENINGIOMA RESECTION Right 04/2004   APPENDECTOMY     BREAST EXCISIONAL BIOPSY Bilateral 1980's   NEG   BREAST SURGERY     CATARACT EXTRACTION, BILATERAL Bilateral    COLONOSCOPY     COLONOSCOPY WITH PROPOFOL N/A 01/12/2020   Procedure: COLONOSCOPY WITH PROPOFOL;  Surgeon: Lucilla Lame, MD;  Location: [facility] ENDOSCOPY;  Service: Endoscopy;  Laterality: N/A;   ESOPHAGOGASTRODUODENOSCOPY (EGD) WITH PROPOFOL N/A 01/12/2020   Procedure: ESOPHAGOGASTRODUODENOSCOPY (EGD) WITH PROPOFOL;  Surgeon: Lucilla Lame, MD;  Location: [facility] ENDOSCOPY;  Service: Endoscopy;  Laterality: N/A;   EYE SURGERY     HEMORROIDECTOMY     IMPLANTATION VOCAL CORD Right    removal of brain tumor     SHOULDER ARTHROSCOPY Right    rotator cuff repair   THYROID GROWTH REMOVAL  1985    Prior to Admission medications   Medication Sig Start Date End Date Taking? Authorizing Provider  Calcium Carb-Cholecalciferol (CALTRATE 600+D3 PO) Take by mouth.   Yes [provider]   carvedilol (COREG) 6.25 MG tablet  09/19/20  Yes [provider]  Fe Bisgly-Vit C-Vit B12-FA 28-60-0.008-0.4 MG CAPS Take 1-3 capsules by mouth daily. THORNE: IRON BISGLY   Yes [provider]  levothyroxine (SYNTHROID) 100 MCG tablet Take 100 mcg by mouth daily before breakfast.   Yes [provider]  omeprazole (PRILOSEC) 20 MG capsule Take 20 mg by mouth daily.   Yes [provider]  oxybutynin (DITROPAN-XL) 5 MG 24 hr tablet Take by mouth. 11/28/20 11/28/21 Yes [provider]  sertraline (ZOLOFT) 100 MG tablet Take 100 mg by mouth daily.   Yes [provider]  loperamide (IMODIUM A-D) 2 MG tablet Take 1-2 tablets (2-4 mg total) by mouth 4 (four) times daily as needed for diarrhea or loose stools. 02/11/20   Tylene Fantasia, PA-C  LORazepam (ATIVAN) 1 MG tablet Take 1 mg by mouth 3 (three) times daily as needed for anxiety. 09/12/19   [provider]    Allergies as of 11/09/2021 - Review Complete 05/31/2021  Allergen Reaction Noted   Codeine Itching 06/08/2017    Family History  Problem Relation Age of Onset   Breast cancer Cousin 78   Hernia Mother    Dysphagia Mother    Lung cancer Father    Brain cancer Brother     Social History   Socioeconomic History   Marital status: Widowed  Spouse name: Not on file   Number of children: Not on file   Years of education: Not on file   Highest education level: Not on file  Occupational History   Not on file  Tobacco Use   Smoking status: Never   Smokeless tobacco: Never  Vaping Use   Vaping Use: Never used  Substance and Sexual Activity   Alcohol use: Not Currently   Drug use: Never   Sexual activity: Not on file  Other Topics Concern   Not on file  Social History Narrative   Not on file   Social Determinants of Health   Financial Resource Strain: Not on file  Food Insecurity: Not on file  Transportation Needs: Not on file  Physical Activity: Not on file   Stress: Not on file  Social Connections: Not on file  Intimate Partner Violence: Not on file    Review of Systems: See HPI, otherwise negative ROS  Physical Exam: BP 136/86   Pulse 100   Temp 97.6 F (36.4 C) (Temporal)   Resp 18   Ht '5\' 3"'$  (1.6 m)   Wt 54.9 kg   SpO2 97%   BMI 21.43 kg/m  General:   Alert,  pleasant and cooperative in NAD Head:  Normocephalic and atraumatic. Neck:  Supple; no masses or thyromegaly. Lungs:  Clear throughout to auscultation.    Heart:  Regular rate and rhythm. Abdomen:  Soft, nontender and nondistended. Normal bowel sounds, without guarding, and without rebound.   Neurologic:  Alert and  oriented x4;  grossly normal neurologically.  Impression/Plan: Cynthia Mcgrath is here for an colonoscopy to be performed for personal history of colon cancer  Risks, benefits, limitations, and alternatives regarding  colonoscopy have been reviewed with the patient.  Questions have been answered.  All parties agreeable.   Lucilla Lame, MD  11/16/2021, 9:46 AM

## 2021-11-17 ENCOUNTER — Encounter: Payer: Self-pay | Admitting: Gastroenterology

## 2021-11-30 ENCOUNTER — Other Ambulatory Visit: Payer: Medicare HMO

## 2021-12-04 ENCOUNTER — Ambulatory Visit: Payer: Medicare HMO | Admitting: Oncology

## 2021-12-13 DIAGNOSIS — F32A Depression, unspecified: Secondary | ICD-10-CM | POA: Diagnosis not present

## 2021-12-13 DIAGNOSIS — E7800 Pure hypercholesterolemia, unspecified: Secondary | ICD-10-CM | POA: Diagnosis not present

## 2021-12-13 DIAGNOSIS — D509 Iron deficiency anemia, unspecified: Secondary | ICD-10-CM | POA: Diagnosis not present

## 2021-12-13 DIAGNOSIS — E034 Atrophy of thyroid (acquired): Secondary | ICD-10-CM | POA: Diagnosis not present

## 2021-12-31 IMAGING — CT CT ABD-PELV W/ CM
2 of 5 series · 12 of 36 positions shown, 15 images · IV contrast (omnipaque)
Comparison: None.

CLINICAL DATA: Recently diagnosed colon cancer at colonoscopy.
Previous appendectomy.

EXAM:
CT CHEST, ABDOMEN, AND PELVIS WITH CONTRAST
TECHNIQUE: Multidetector CT imaging of the chest, abdomen and pelvis was
performed following the standard protocol during bolus
administration of intravenous contrast.
CONTRAST:  100mL OMNIPAQUE IOHEXOL 300 MG/ML  SOLN

[Series 504: cap with · axial · 0.69mm/px · z∈[+312,+772]mm · 9 of 114 slices shown, 12 images]
[im 11/114  mediastinal]
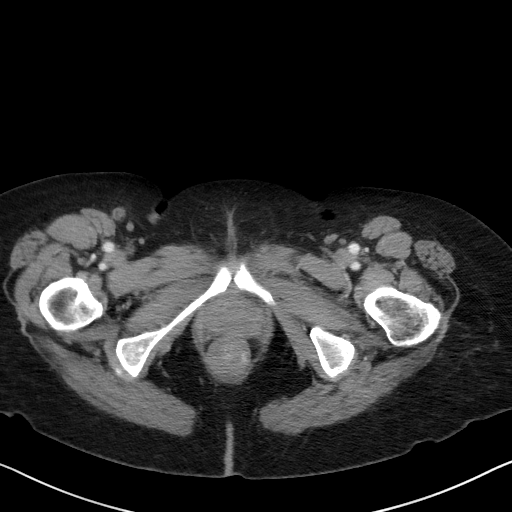
[im 11/114  lung]
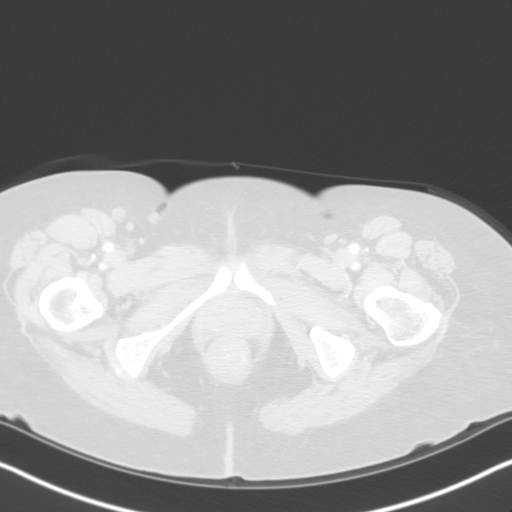
[im 21/114  lung]
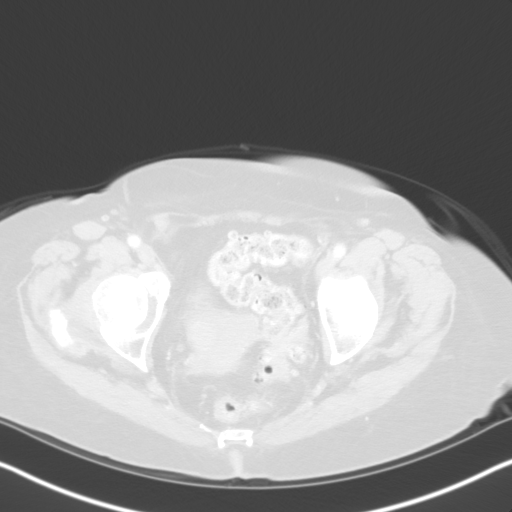
[im 31/114  lung]
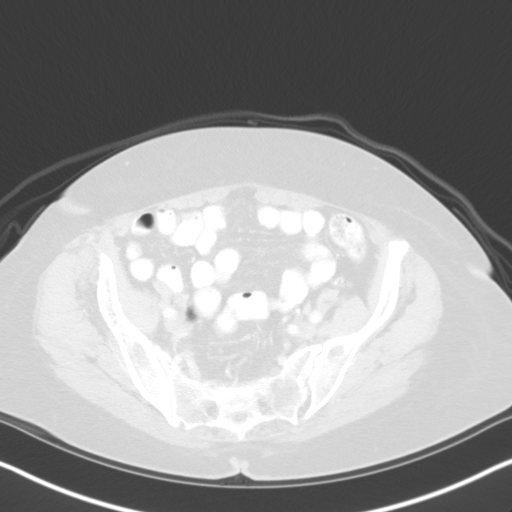
[im 42/114  lung]
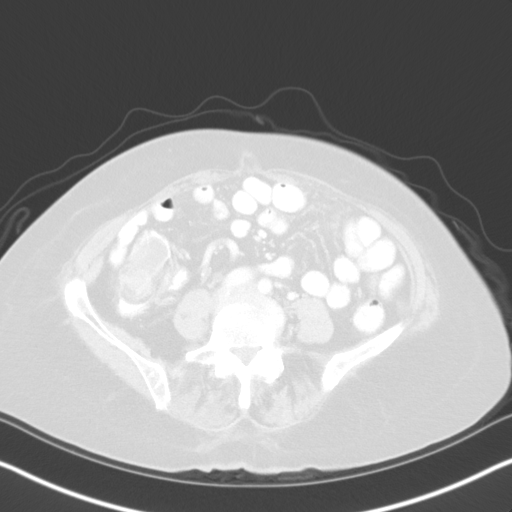
[im 62/114  mediastinal]
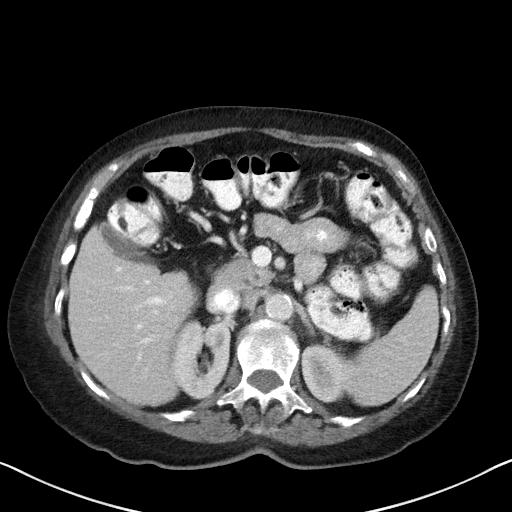
[im 62/114  lung]
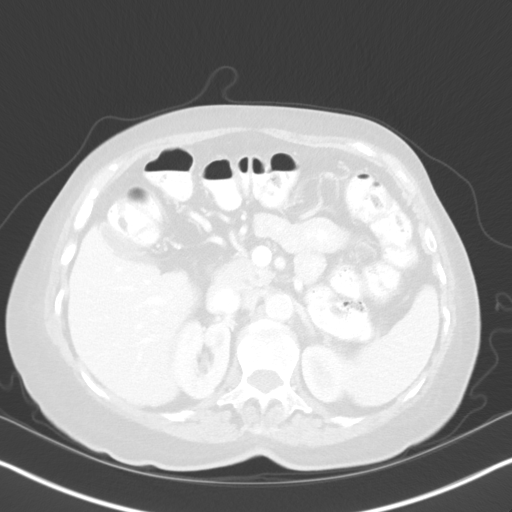
[im 72/114  lung]
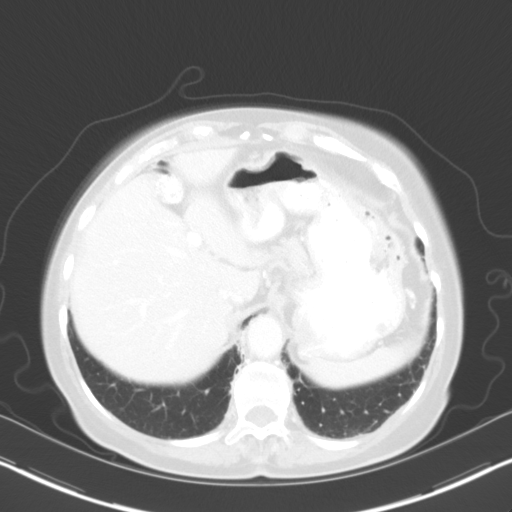
[im 83/114  lung]
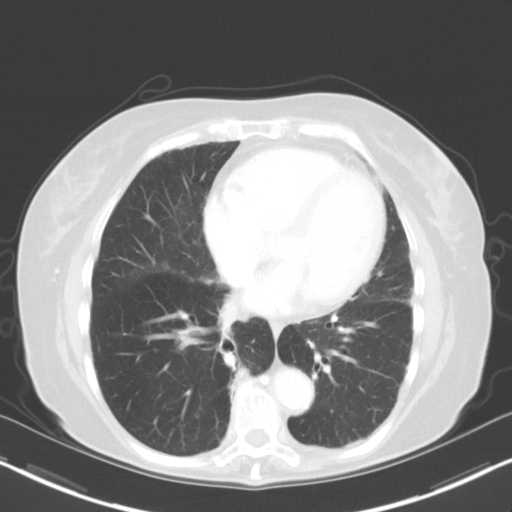
[im 93/114  lung]
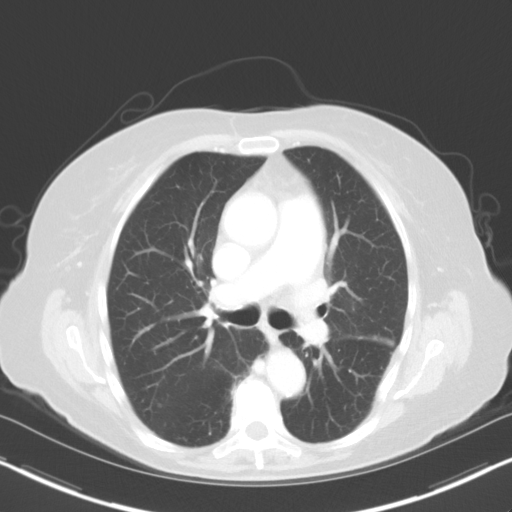
[im 103/114  mediastinal]
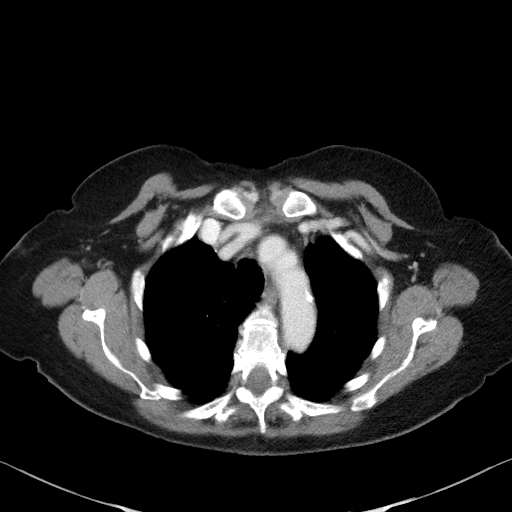
[im 103/114  lung]
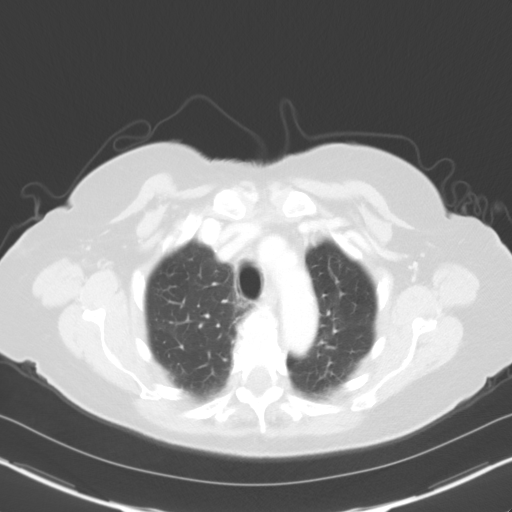

[Series 507: coronals · coronal · 0.64mm/px · 3 of 136 slices shown]
[im 28/136  lung]
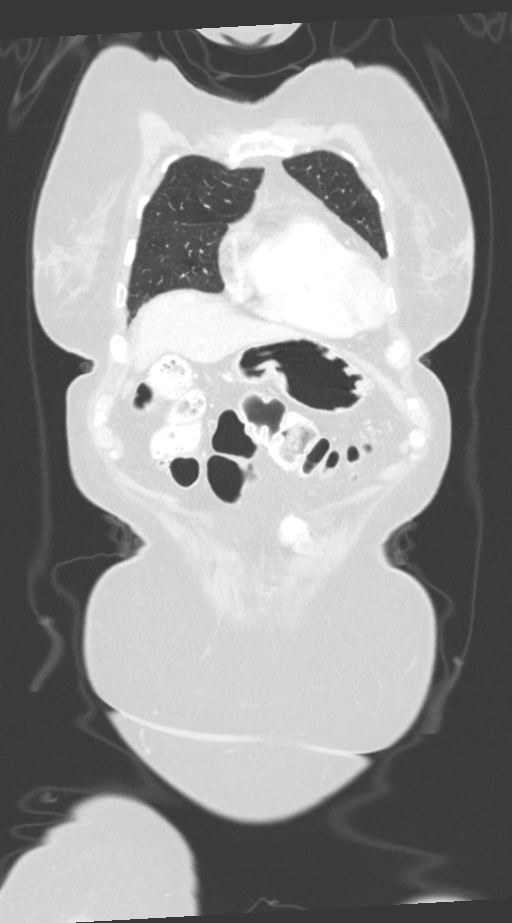
[im 55/136  lung]
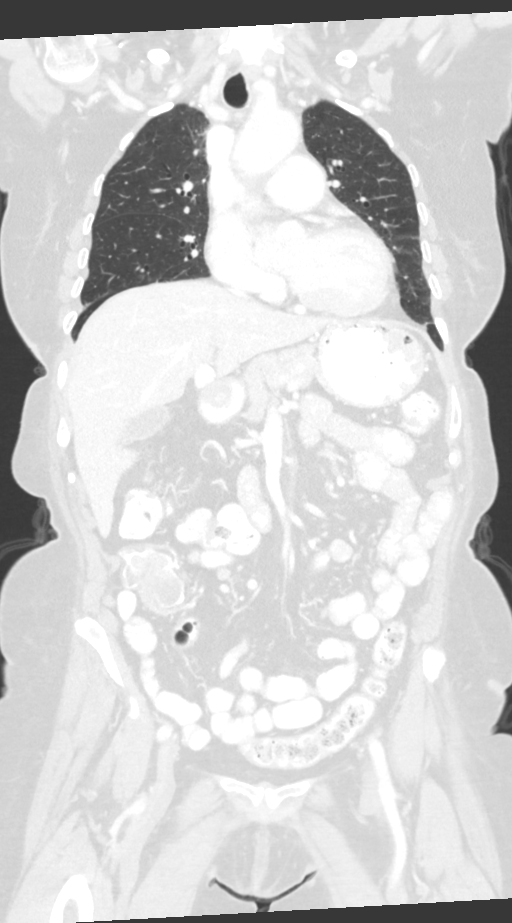
[im 82/136  lung]
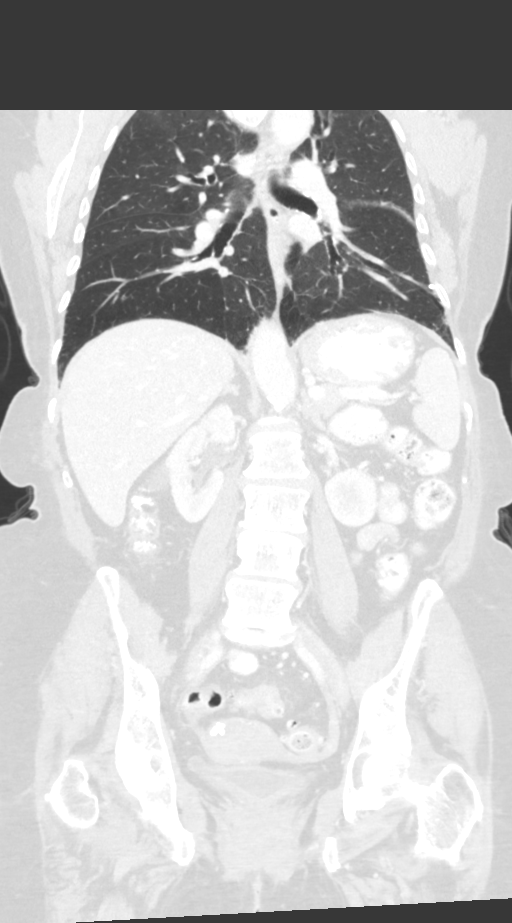

[12 of 36 positions shown; findings below may reference images not displayed]

FINDINGS: CT CHEST FINDINGS

Cardiovascular: Mildly enlarged heart. Mild atheromatous aortic
calcifications. Approximately 60% luminal stenosis involving the
proximal portion of the left subclavian artery due to soft plaque
formation.

Mediastinum/Nodes: The left lobe of the thyroid gland may be
surgically absent. Normal appearing right lobe. No enlarged lymph
nodes. Unremarkable esophagus.

Lungs/Pleura: Changes of cylindrical bronchiectasis at the medial
aspect of the right lower lobe. No lung nodules or pleural fluid
seen.

Musculoskeletal: Thoracic spine and lower cervical spine
degenerative changes. No evidence of bony metastatic disease.

CT ABDOMEN PELVIS FINDINGS

Hepatobiliary: 6 mm oval area of low density in caudate lobe of the
liver. On coronal image number 73 series 5. This has an appearance
suggesting a small cyst. This is also seen on sagittal image number
65 series 7 and axial image number 50 series 2.

There is a slightly smaller, similar-appearing area more posteriorly
in the right lobe of the liver on coronal image number 91 series 5,
sagittal image number 38 series 7 and axial image number 20 series
6.

Poorly distended, normal appearing gallbladder.

Pancreas: Unremarkable. No pancreatic ductal dilatation or
surrounding inflammatory changes.

Spleen: Normal in size without focal abnormality.

Adrenals/Urinary Tract: Normal appearing adrenal glands. Bilateral
renal cysts. Unremarkable ureters and urinary bladder.

Stomach/Bowel: Heterogeneous right colon mass measuring 4.6 x 3.3 cm
on image number 71 series 2 and 4.2 cm in length on coronal image
number 59 series 5. This is almost completely obstructing the lumen
and has a small extension laterally that appears to extend through
the wall of the colon.

Multiple sigmoid colon diverticula without evidence of
diverticulitis. No other colon masses are seen.

Unremarkable stomach and small bowel.

Vascular/Lymphatic: There are 2 mildly enlarged right lower quadrant
mesenteric lymph nodes on image number 71 series 2. The more
anterior node has a short axis diameter of 6 mm and the more
posterior node has a short axis diameter of 7 mm. In the coronal
plane, the more posterior node has a short axis diameter of 8 mm and
the more anterior node has a short axis diameter of 7 mm on images
64 series 5 and 56 series 5 respectively.

No enlarged lymph nodes elsewhere in the abdomen or pelvis.

Reproductive: Calcified uterine fibroids. No adnexal masses.

Other: Small bilateral proximal inguinal hernias containing fat.
Right anterolateral abdominal wall intramuscular lipoma.

Musculoskeletal: Lumbar spine degenerative changes. No evidence of
bony metastatic disease.
IMPRESSION: 1. 4.6 x 3.3 x 4.2 cm heterogeneous right colon mass, almost
completely obstructing the lumen and with a small extension
laterally that appears to extend through the wall of the colon. This
is compatible with the patient's known primary colon carcinoma.
2. 2 mildly enlarged right lower quadrant mesenteric lymph nodes,
suspicious for metastatic adenopathy.
3. Two small, nonspecific liver lesions, as described above. These
are too small to accurately characterize and most likely represent
small cysts. Small metastases are less likely.
4. Sigmoid diverticulosis.
5. Changes of cylindrical bronchiectasis at the medial aspect of the
right lower lobe.
6. Approximately 60% luminal stenosis involving the proximal portion
of the left subclavian artery due to soft plaque formation.
7. Calcified uterine fibroids.
8. Aortic atherosclerosis.

Aortic Atherosclerosis (7DQFV-AXV.V).

## 2021-12-31 IMAGING — CT CT CHEST W/ CM
2 of 5 series · 12 of 36 positions shown, 15 images · IV contrast (omnipaque)
Comparison: None.

CLINICAL DATA: Recently diagnosed colon cancer at colonoscopy.
Previous appendectomy.

EXAM:
CT CHEST, ABDOMEN, AND PELVIS WITH CONTRAST
TECHNIQUE: Multidetector CT imaging of the chest, abdomen and pelvis was
performed following the standard protocol during bolus
administration of intravenous contrast.
CONTRAST:  100mL OMNIPAQUE IOHEXOL 300 MG/ML  SOLN

[Series 2: cap with · axial · 0.69mm/px · z∈[+312,+772]mm · 9 of 114 slices shown, 12 images]
[im 11/114  mediastinal]
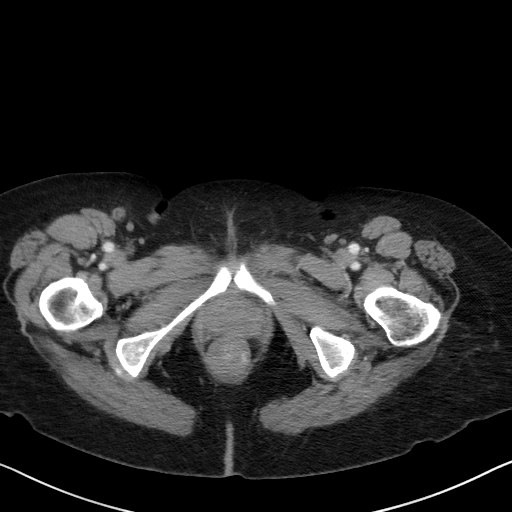
[im 11/114  lung]
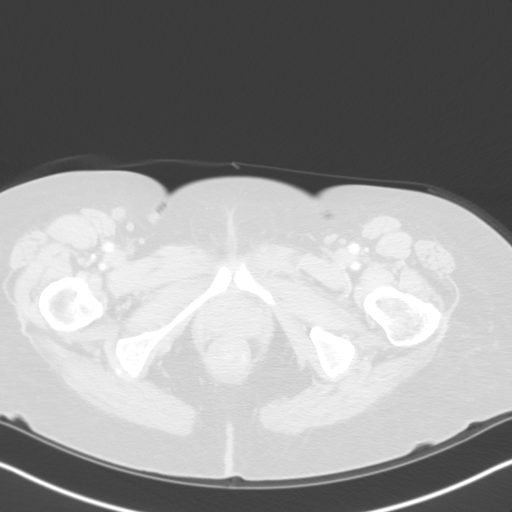
[im 21/114  lung]
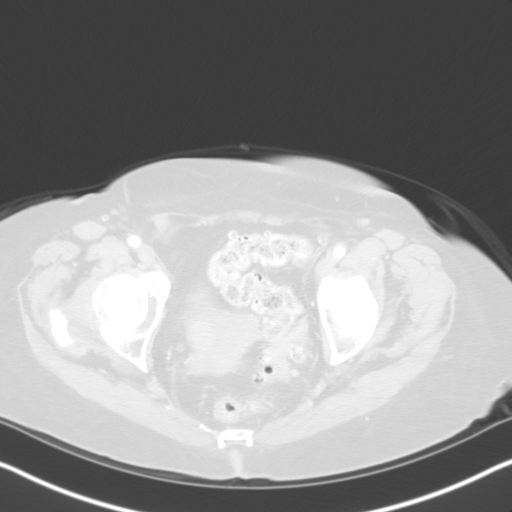
[im 31/114  lung]
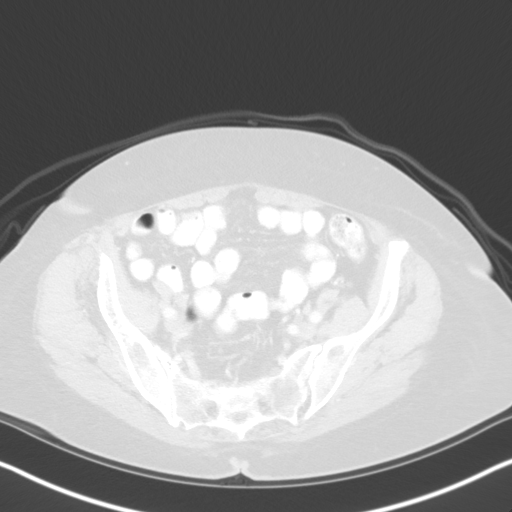
[im 42/114  lung]
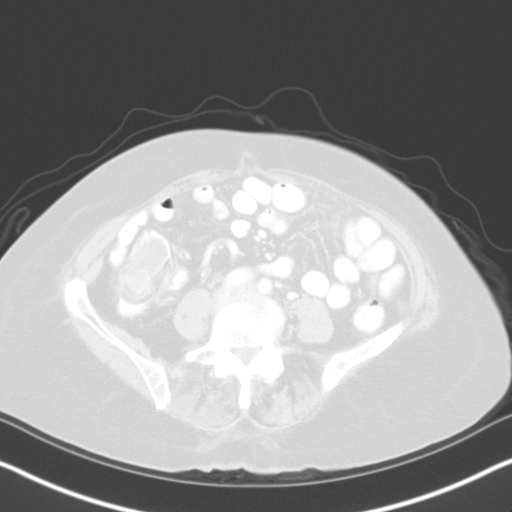
[im 62/114  mediastinal]
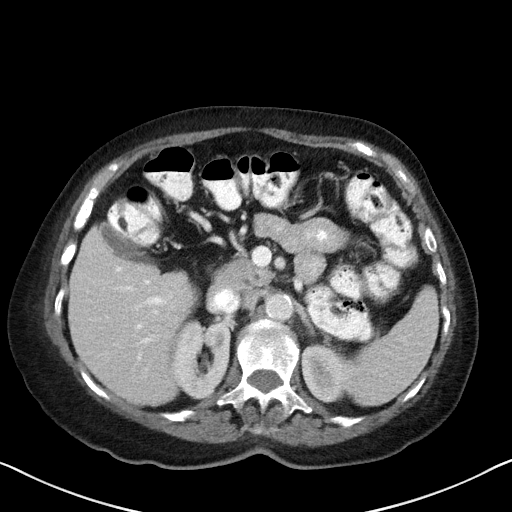
[im 62/114  lung]
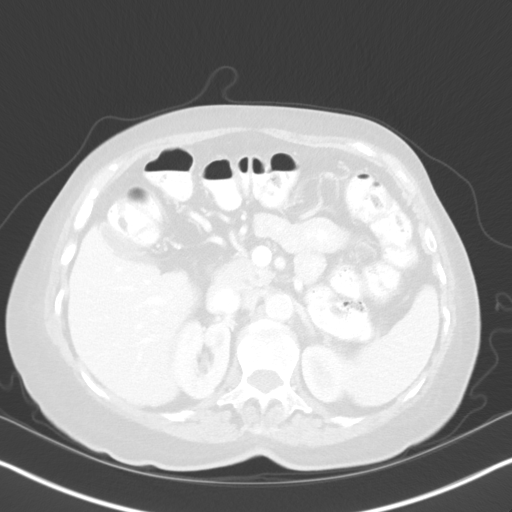
[im 72/114  lung]
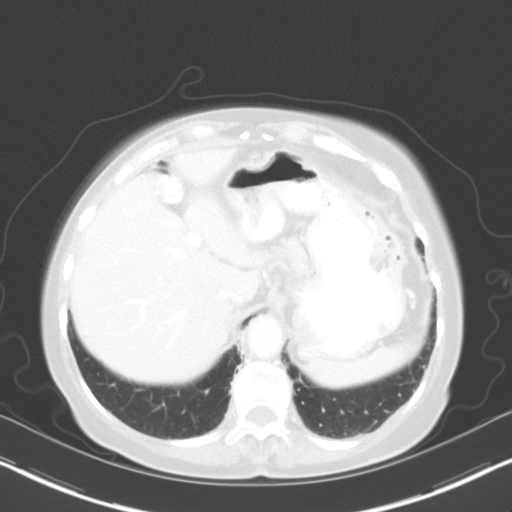
[im 83/114  lung]
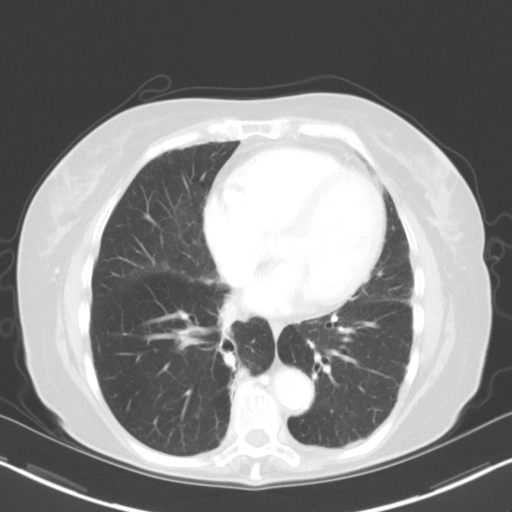
[im 93/114  lung]
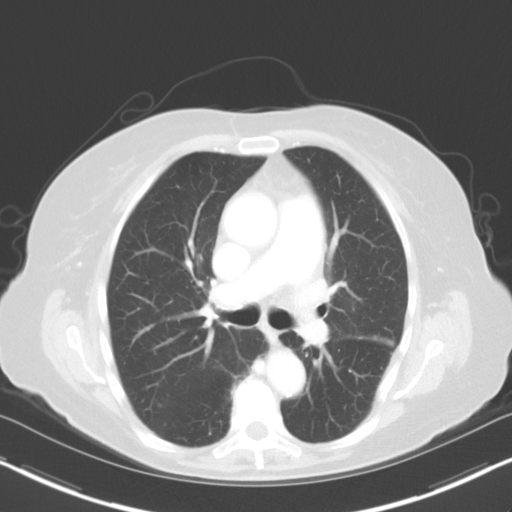
[im 103/114  mediastinal]
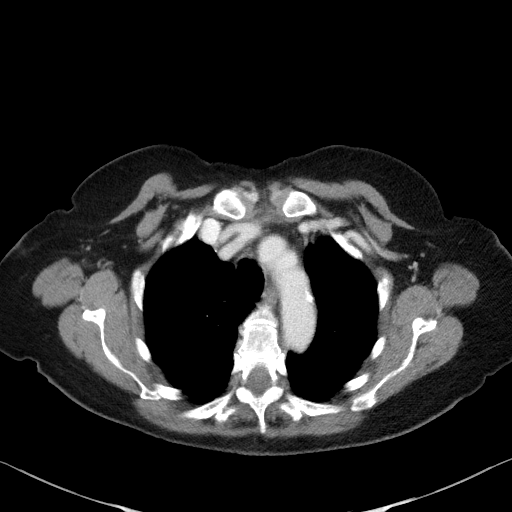
[im 103/114  lung]
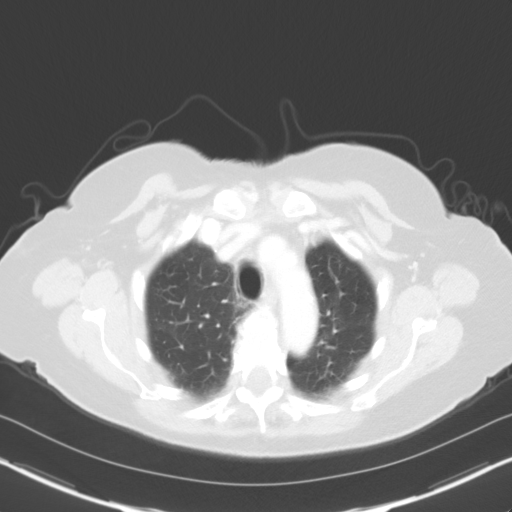

[Series 5: coronals · coronal · 0.64mm/px · 3 of 136 slices shown]
[im 28/136  lung]
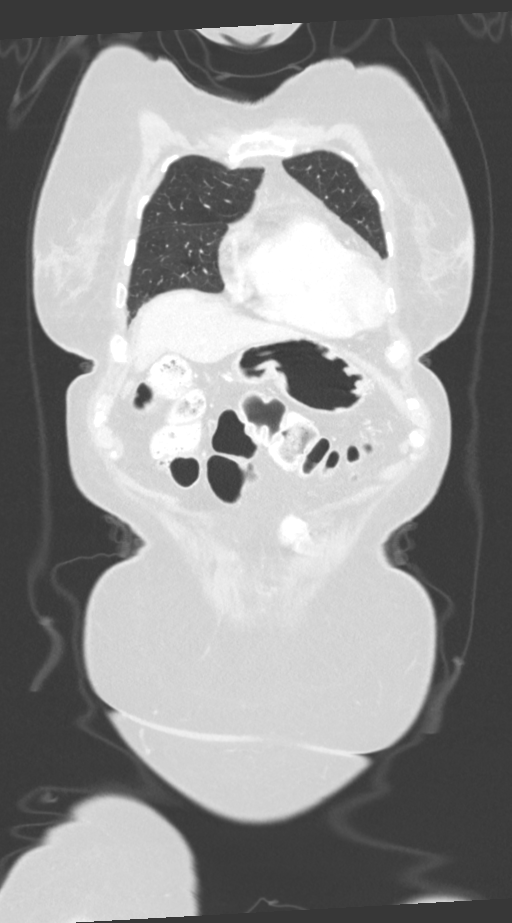
[im 55/136  lung]
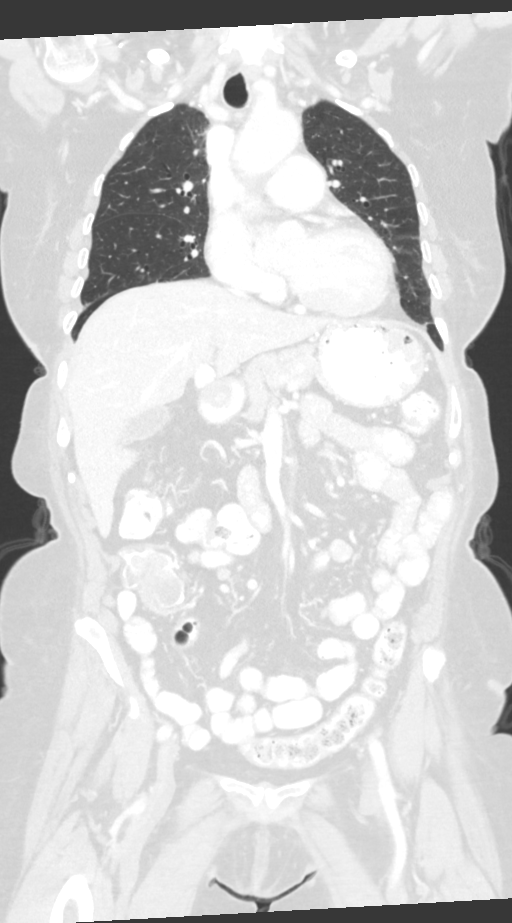
[im 82/136  lung]
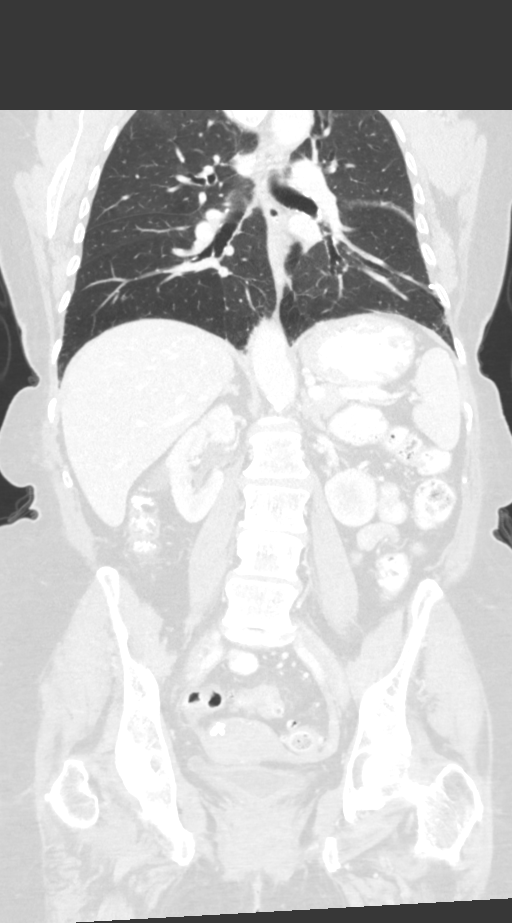

[12 of 36 positions shown; findings below may reference images not displayed]

FINDINGS: CT CHEST FINDINGS

Cardiovascular: Mildly enlarged heart. Mild atheromatous aortic
calcifications. Approximately 60% luminal stenosis involving the
proximal portion of the left subclavian artery due to soft plaque
formation.

Mediastinum/Nodes: The left lobe of the thyroid gland may be
surgically absent. Normal appearing right lobe. No enlarged lymph
nodes. Unremarkable esophagus.

Lungs/Pleura: Changes of cylindrical bronchiectasis at the medial
aspect of the right lower lobe. No lung nodules or pleural fluid
seen.

Musculoskeletal: Thoracic spine and lower cervical spine
degenerative changes. No evidence of bony metastatic disease.

CT ABDOMEN PELVIS FINDINGS

Hepatobiliary: 6 mm oval area of low density in caudate lobe of the
liver. On coronal image number 73 series 5. This has an appearance
suggesting a small cyst. This is also seen on sagittal image number
65 series 7 and axial image number 50 series 2.

There is a slightly smaller, similar-appearing area more posteriorly
in the right lobe of the liver on coronal image number 91 series 5,
sagittal image number 38 series 7 and axial image number 20 series
6.

Poorly distended, normal appearing gallbladder.

Pancreas: Unremarkable. No pancreatic ductal dilatation or
surrounding inflammatory changes.

Spleen: Normal in size without focal abnormality.

Adrenals/Urinary Tract: Normal appearing adrenal glands. Bilateral
renal cysts. Unremarkable ureters and urinary bladder.

Stomach/Bowel: Heterogeneous right colon mass measuring 4.6 x 3.3 cm
on image number 71 series 2 and 4.2 cm in length on coronal image
number 59 series 5. This is almost completely obstructing the lumen
and has a small extension laterally that appears to extend through
the wall of the colon.

Multiple sigmoid colon diverticula without evidence of
diverticulitis. No other colon masses are seen.

Unremarkable stomach and small bowel.

Vascular/Lymphatic: There are 2 mildly enlarged right lower quadrant
mesenteric lymph nodes on image number 71 series 2. The more
anterior node has a short axis diameter of 6 mm and the more
posterior node has a short axis diameter of 7 mm. In the coronal
plane, the more posterior node has a short axis diameter of 8 mm and
the more anterior node has a short axis diameter of 7 mm on images
64 series 5 and 56 series 5 respectively.

No enlarged lymph nodes elsewhere in the abdomen or pelvis.

Reproductive: Calcified uterine fibroids. No adnexal masses.

Other: Small bilateral proximal inguinal hernias containing fat.
Right anterolateral abdominal wall intramuscular lipoma.

Musculoskeletal: Lumbar spine degenerative changes. No evidence of
bony metastatic disease.
IMPRESSION: 1. 4.6 x 3.3 x 4.2 cm heterogeneous right colon mass, almost
completely obstructing the lumen and with a small extension
laterally that appears to extend through the wall of the colon. This
is compatible with the patient's known primary colon carcinoma.
2. 2 mildly enlarged right lower quadrant mesenteric lymph nodes,
suspicious for metastatic adenopathy.
3. Two small, nonspecific liver lesions, as described above. These
are too small to accurately characterize and most likely represent
small cysts. Small metastases are less likely.
4. Sigmoid diverticulosis.
5. Changes of cylindrical bronchiectasis at the medial aspect of the
right lower lobe.
6. Approximately 60% luminal stenosis involving the proximal portion
of the left subclavian artery due to soft plaque formation.
7. Calcified uterine fibroids.
8. Aortic atherosclerosis.

Aortic Atherosclerosis (7DQFV-AXV.V).

## 2022-01-12 DIAGNOSIS — E034 Atrophy of thyroid (acquired): Secondary | ICD-10-CM | POA: Diagnosis not present

## 2022-01-25 DIAGNOSIS — H35379 Puckering of macula, unspecified eye: Secondary | ICD-10-CM | POA: Diagnosis not present

## 2022-01-25 DIAGNOSIS — Z0100 Encounter for examination of eyes and vision without abnormal findings: Secondary | ICD-10-CM | POA: Diagnosis not present

## 2022-01-25 DIAGNOSIS — H43813 Vitreous degeneration, bilateral: Secondary | ICD-10-CM | POA: Diagnosis not present

## 2022-02-02 DIAGNOSIS — R399 Unspecified symptoms and signs involving the genitourinary system: Secondary | ICD-10-CM | POA: Diagnosis not present

## 2022-02-05 DIAGNOSIS — N3000 Acute cystitis without hematuria: Secondary | ICD-10-CM | POA: Diagnosis not present

## 2022-03-20 DIAGNOSIS — E538 Deficiency of other specified B group vitamins: Secondary | ICD-10-CM | POA: Diagnosis not present

## 2022-03-20 DIAGNOSIS — F339 Major depressive disorder, recurrent, unspecified: Secondary | ICD-10-CM | POA: Diagnosis not present

## 2022-03-20 DIAGNOSIS — G3184 Mild cognitive impairment, so stated: Secondary | ICD-10-CM | POA: Diagnosis not present

## 2022-03-23 ENCOUNTER — Other Ambulatory Visit: Payer: Self-pay | Admitting: Physician Assistant

## 2022-03-23 DIAGNOSIS — R413 Other amnesia: Secondary | ICD-10-CM

## 2022-04-05 ENCOUNTER — Encounter: Payer: Self-pay | Admitting: Physician Assistant

## 2022-04-06 ENCOUNTER — Ambulatory Visit
Admission: RE | Admit: 2022-04-06 | Discharge: 2022-04-06 | Disposition: A | Discharge status: Home / Self Care | Payer: Medicare HMO | Source: Ambulatory Visit | Attending: Physician Assistant | Admitting: Physician Assistant

## 2022-04-06 DIAGNOSIS — G9389 Other specified disorders of brain: Secondary | ICD-10-CM | POA: Diagnosis not present

## 2022-04-06 DIAGNOSIS — R413 Other amnesia: Secondary | ICD-10-CM

## 2022-04-13 ENCOUNTER — Other Ambulatory Visit: Payer: Self-pay | Admitting: Physician Assistant

## 2022-04-13 DIAGNOSIS — R9089 Other abnormal findings on diagnostic imaging of central nervous system: Secondary | ICD-10-CM

## 2022-04-13 DIAGNOSIS — Z86018 Personal history of other benign neoplasm: Secondary | ICD-10-CM

## 2022-04-17 ENCOUNTER — Ambulatory Visit
Admission: RE | Admit: 2022-04-17 | Discharge: 2022-04-17 | Disposition: A | Discharge status: Home / Self Care | Payer: Medicare HMO | Source: Ambulatory Visit | Attending: Physician Assistant | Admitting: Physician Assistant

## 2022-04-17 ENCOUNTER — Other Ambulatory Visit: Payer: Self-pay | Admitting: Physician Assistant

## 2022-04-17 DIAGNOSIS — R9089 Other abnormal findings on diagnostic imaging of central nervous system: Secondary | ICD-10-CM

## 2022-04-17 DIAGNOSIS — Z86018 Personal history of other benign neoplasm: Secondary | ICD-10-CM

## 2022-04-17 MED ORDER — GADOPICLENOL 0.5 MMOL/ML IV SOLN
5.0000 mL | Freq: Once | INTRAVENOUS | Status: AC | PRN
  Administered 2022-04-17: 5 mL via INTRAVENOUS

## 2022-04-18 DIAGNOSIS — D329 Benign neoplasm of meninges, unspecified: Secondary | ICD-10-CM | POA: Diagnosis not present

## 2022-05-01 DIAGNOSIS — R399 Unspecified symptoms and signs involving the genitourinary system: Secondary | ICD-10-CM | POA: Diagnosis not present

## 2022-06-07 DIAGNOSIS — E034 Atrophy of thyroid (acquired): Secondary | ICD-10-CM | POA: Diagnosis not present

## 2022-06-14 DIAGNOSIS — E034 Atrophy of thyroid (acquired): Secondary | ICD-10-CM | POA: Diagnosis not present

## 2022-06-14 DIAGNOSIS — E7800 Pure hypercholesterolemia, unspecified: Secondary | ICD-10-CM | POA: Diagnosis not present

## 2022-06-14 DIAGNOSIS — Z0000 Encounter for general adult medical examination without abnormal findings: Secondary | ICD-10-CM | POA: Diagnosis not present

## 2022-06-14 DIAGNOSIS — F32A Depression, unspecified: Secondary | ICD-10-CM | POA: Diagnosis not present

## 2022-06-14 DIAGNOSIS — Z1331 Encounter for screening for depression: Secondary | ICD-10-CM | POA: Diagnosis not present

## 2022-06-14 DIAGNOSIS — F411 Generalized anxiety disorder: Secondary | ICD-10-CM | POA: Diagnosis not present

## 2022-06-14 DIAGNOSIS — Z0001 Encounter for general adult medical examination with abnormal findings: Secondary | ICD-10-CM | POA: Diagnosis not present

## 2022-08-14 ENCOUNTER — Emergency Department
Admission: EM | Admit: 2022-08-14 | Discharge: 2022-08-14 | Disposition: A | Discharge status: Home / Self Care | Payer: Medicare HMO | Attending: Emergency Medicine | Admitting: Emergency Medicine

## 2022-08-14 ENCOUNTER — Other Ambulatory Visit: Payer: Self-pay

## 2022-08-14 DIAGNOSIS — R531 Weakness: Secondary | ICD-10-CM | POA: Insufficient documentation

## 2022-08-14 DIAGNOSIS — R630 Anorexia: Secondary | ICD-10-CM | POA: Diagnosis not present

## 2022-08-14 DIAGNOSIS — R42 Dizziness and giddiness: Secondary | ICD-10-CM | POA: Insufficient documentation

## 2022-08-14 DIAGNOSIS — E039 Hypothyroidism, unspecified: Secondary | ICD-10-CM | POA: Diagnosis not present

## 2022-08-14 DIAGNOSIS — R5381 Other malaise: Secondary | ICD-10-CM | POA: Diagnosis not present

## 2022-08-14 DIAGNOSIS — Z86011 Personal history of benign neoplasm of the brain: Secondary | ICD-10-CM | POA: Insufficient documentation

## 2022-08-14 DIAGNOSIS — Z20822 Contact with and (suspected) exposure to covid-19: Secondary | ICD-10-CM | POA: Insufficient documentation

## 2022-08-14 DIAGNOSIS — I10 Essential (primary) hypertension: Secondary | ICD-10-CM | POA: Diagnosis not present

## 2022-08-14 LAB — BASIC METABOLIC PANEL
Anion gap: 6 (ref 5–15)
BUN: 13 mg/dL (ref 8–23)
CO2: 25 mmol/L (ref 22–32)
Calcium: 8.3 mg/dL — ABNORMAL LOW (ref 8.9–10.3)
Chloride: 106 mmol/L (ref 98–111)
Creatinine, Ser: 0.78 mg/dL (ref 0.44–1.00)
GFR, Estimated: >60 mL/min (ref >60)
Glucose, Bld: 88 mg/dL (ref 70–99)
Potassium: 3.7 mmol/L (ref 3.5–5.1)
Sodium: 137 mmol/L (ref 135–145)

## 2022-08-14 LAB — URINALYSIS, ROUTINE W REFLEX MICROSCOPIC
Bacteria, UA: NONE SEEN
Bilirubin Urine: NEGATIVE
Glucose, UA: NEGATIVE mg/dL
Hgb urine dipstick: NEGATIVE
Ketones, ur: NEGATIVE mg/dL
Nitrite: NEGATIVE
Protein, ur: NEGATIVE mg/dL
Specific Gravity, Urine: 1.018 (ref 1.005–1.030)
pH: 5 (ref 5.0–8.0)

## 2022-08-14 LAB — TROPONIN I (HIGH SENSITIVITY): Troponin I (High Sensitivity): 4 ng/L (ref <18)

## 2022-08-14 LAB — CBC
HCT: 40.4 % (ref 36.0–46.0)
Hemoglobin: 13.1 g/dL (ref 12.0–15.0)
MCH: 30.9 pg (ref 26.0–34.0)
MCHC: 32.4 g/dL (ref 30.0–36.0)
MCV: 95.3 fL (ref 80.0–100.0)
Platelets: 217 10*3/uL (ref 150–400)
RBC: 4.24 MIL/uL (ref 3.87–5.11)
RDW: 12.3 % (ref 11.5–15.5)
WBC: 4.7 10*3/uL (ref 4.0–10.5)
nRBC: 0 % (ref 0.0–0.2)

## 2022-08-14 LAB — SARS CORONAVIRUS 2 BY RT PCR: SARS Coronavirus 2 by RT PCR: NEGATIVE

## 2022-08-14 MED ORDER — SODIUM CHLORIDE 0.9 % IV BOLUS
500.0000 mL | Freq: Once | INTRAVENOUS | Status: AC
  Administered 2022-08-14: 500 mL via INTRAVENOUS

## 2022-08-14 NOTE — ED Triage Notes (Signed)
Pt sts that she has not been very hungry. Pt sts that she is just not feeling well. Pt that she has felt like this for a couple of days. Pt sts that she gets to doing things around the house and forgets to take her pills until around the afternoon. Pt sts that some times she eats good and other times she does not.

## 2022-08-14 NOTE — ED Provider Notes (Signed)
[facility] Provider Note    Event Date/Time   First MD Initiated Contact with Patient 08/14/22 1318     (approximate)   History   Weakness   HPI  Cynthia Mcgrath is a 82 y.o. female with a history of hypertension, hyperlipidemia, hypothyroidism, osteoporosis, and meningioma status postresection who presents with generalized weakness and lightheadedness over the last couple days, slightly worsened today but now improved.  The patient states that she will sometimes feel lightheaded when walking around and feels somewhat weaker than normal.  She also reports decreased appetite and some malaise.  She states she has forgotten to take her medications on time a few times.  She denies any fever or chills, vomiting or diarrhea, chest pain, difficulty breathing, or any focal weakness or numbness.  She states she is feeling generally better now.  I reviewed the past medical records.  The patient's most recent outpatient encounter was with primary care on 6/13 for an annual wellness visit.  She has no recent hospitalizations.   Physical Exam   Triage Vital Signs: ED Triage Vitals  Encounter Vitals Group     BP 08/14/22 1220 135/77     Systolic BP Percentile --      Diastolic BP Percentile --      Pulse Rate 08/14/22 1220 86     Resp 08/14/22 1220 17     Temp 08/14/22 1220 98.4 F (36.9 C)     Temp Source 08/14/22 1220 Oral     SpO2 08/14/22 1220 100 %     Weight 08/14/22 1221 112 lb (50.8 kg)     Height 08/14/22 1221 5\' 3"  (1.6 m)     Head Circumference --      Peak Flow --      Pain Score 08/14/22 1221 0     Pain Loc --      Pain Education --      Exclude from Growth Chart --     Most recent vital signs: Vitals:   08/14/22 1500 08/14/22 1530  BP: (!) 147/65 (!) 144/78  Pulse: (!) 59 71  Resp:    Temp:    SpO2: 96% 99%     General: Alert, well-appearing, no distress.  CV:  Good peripheral perfusion.  Normal heart sounds. Resp:  Normal effort.   Lungs CTAB. Abd:  No distention.  Other:  EOMI.  PERRLA.  No facial droop.  Normal speech.  Motor and sensory intact in all extremities.  Normal coordination.  No ataxia on finger-to-nose.  Normal gait.   ED Results / Procedures / Treatments   Labs (all labs ordered are listed, but only abnormal results are displayed) Labs Reviewed  BASIC METABOLIC PANEL - Abnormal; Notable for the following components:      Result Value   Calcium 8.3 (*)    All other components within normal limits  URINALYSIS, ROUTINE W REFLEX MICROSCOPIC - Abnormal; Notable for the following components:   Color, Urine YELLOW (*)    APPearance HAZY (*)    Leukocytes,Ua SMALL (*)    All other components within normal limits  SARS CORONAVIRUS 2 BY RT PCR  CBC  TROPONIN I (HIGH SENSITIVITY)     EKG  ED ECG REPORT I, Dionne Bucy, the attending physician, personally viewed and interpreted this ECG.  Date: 08/14/2022 EKG Time: 1232 Rate: 82 Rhythm: normal sinus rhythm QRS Axis: normal Intervals: normal ST/T Wave abnormalities: normal Narrative Interpretation: no evidence of acute ischemia  RADIOLOGY   PROCEDURES:  Critical Care performed: No  Procedures   MEDICATIONS ORDERED IN ED: Medications  sodium chloride 0.9 % bolus 500 mL (0 mLs Intravenous Stopped 08/14/22 1528)     IMPRESSION / MDM / ASSESSMENT AND PLAN / ED COURSE  I reviewed the triage vital signs and the nursing notes.  82 year old female with PMH as noted above presents with generalized weakness over the last couple days associated with some lightheadedness that worsened today although is now resolved.  On exam the patient is very well-appearing.  Her vital signs are normal.  Physical exam including neurologic exam is unremarkable.  Differential diagnosis includes, but is not limited to, dehydration, electrolyte abnormality, other metabolic cause, UTI or other infection, less likely cardiac etiology.  The patient has no  focal neurologic deficits to suggest CNS cause.  Patient's presentation is most consistent with acute complicated illness / injury requiring diagnostic workup.  The patient received 500 mL of fluid and is now feeling significantly better.  Orthostatics are negative.  Lab workup is overall unremarkable.  There is no leukocytosis or anemia.  Electrolytes are normal.  COVID is negative.  Troponin is negative.  Given the duration of the symptoms there is no indication for repeat.  Urinalysis does show some WBCs but no nitrites or bacteria.  She has no clinical symptoms of UTI.  There is no indication for treatment at this time.  I counseled the patient on the results of the workup.  She states she is feeling well and would like to go home.  I think discharge is reasonable given the negative workup.  I gave strict return precautions and she expressed understanding.   FINAL CLINICAL IMPRESSION(S) / ED DIAGNOSES   Final diagnoses:  Generalized weakness     Rx / DC Orders   ED Discharge Orders     None        Note:  This document was prepared using Dragon voice recognition software and may include unintentional dictation errors.    Dionne Bucy, MD 08/14/22 [phone]

## 2022-08-14 NOTE — Discharge Instructions (Signed)
Drink plenty of fluids.  Return to the ER for new, worsening, or persistent severe weakness or lightheadedness or any other new or worsening symptoms that concern you.

## 2022-08-20 ENCOUNTER — Telehealth: Payer: Self-pay

## 2022-08-20 NOTE — Telephone Encounter (Signed)
Transition Care Management Unsuccessful Follow-up Telephone Call  Date of discharge and from where:  08/14/2022 [facility]  Attempts:  1st Attempt  Reason for unsuccessful TCM follow-up call:  Left voice message  Cynthia Mcgrath [facility]  [facility] Population Health Community Resource Care Guide   [email or web address]  ?? [phone]   Website: [email or web address]  [email or web address]

## 2022-08-21 ENCOUNTER — Telehealth: Payer: Self-pay

## 2022-08-21 NOTE — Telephone Encounter (Signed)
Transition Care Management Unsuccessful Follow-up Telephone Call  Date of discharge and from where:  08/14/2022 [facility]  Attempts:  2nd Attempt  Reason for unsuccessful TCM follow-up call:  Left voice message  Rilda Bulls [facility]  [facility] Population Health Community Resource Care Guide   [email or web address]  ?? [phone]   Website: [email or web address]  [email or web address]

## 2022-08-28 DIAGNOSIS — R531 Weakness: Secondary | ICD-10-CM | POA: Diagnosis not present

## 2022-10-11 DIAGNOSIS — R399 Unspecified symptoms and signs involving the genitourinary system: Secondary | ICD-10-CM | POA: Diagnosis not present

## 2022-12-07 DIAGNOSIS — E034 Atrophy of thyroid (acquired): Secondary | ICD-10-CM | POA: Diagnosis not present

## 2022-12-14 DIAGNOSIS — F411 Generalized anxiety disorder: Secondary | ICD-10-CM | POA: Diagnosis not present

## 2022-12-14 DIAGNOSIS — F32A Depression, unspecified: Secondary | ICD-10-CM | POA: Diagnosis not present

## 2022-12-14 DIAGNOSIS — M810 Age-related osteoporosis without current pathological fracture: Secondary | ICD-10-CM | POA: Diagnosis not present

## 2022-12-14 DIAGNOSIS — E034 Atrophy of thyroid (acquired): Secondary | ICD-10-CM | POA: Diagnosis not present

## 2022-12-14 DIAGNOSIS — E7800 Pure hypercholesterolemia, unspecified: Secondary | ICD-10-CM | POA: Diagnosis not present

## 2023-01-21 ENCOUNTER — Emergency Department: Payer: Medicare HMO

## 2023-01-21 ENCOUNTER — Other Ambulatory Visit: Payer: Self-pay

## 2023-01-21 ENCOUNTER — Emergency Department
Admission: EM | Admit: 2023-01-21 | Discharge: 2023-01-21 | Discharge status: Against Medical Advice | Payer: Medicare HMO | Attending: Student | Admitting: Student

## 2023-01-21 DIAGNOSIS — Z5321 Procedure and treatment not carried out due to patient leaving prior to being seen by health care provider: Secondary | ICD-10-CM | POA: Diagnosis not present

## 2023-01-21 DIAGNOSIS — R5383 Other fatigue: Secondary | ICD-10-CM | POA: Insufficient documentation

## 2023-01-21 DIAGNOSIS — R4781 Slurred speech: Secondary | ICD-10-CM | POA: Insufficient documentation

## 2023-01-21 DIAGNOSIS — R220 Localized swelling, mass and lump, head: Secondary | ICD-10-CM | POA: Diagnosis not present

## 2023-01-21 DIAGNOSIS — I10 Essential (primary) hypertension: Secondary | ICD-10-CM | POA: Diagnosis not present

## 2023-01-21 LAB — BASIC METABOLIC PANEL
Anion gap: 11 (ref 5–15)
BUN: 14 mg/dL (ref 8–23)
CO2: 26 mmol/L (ref 22–32)
Calcium: 8.7 mg/dL — ABNORMAL LOW (ref 8.9–10.3)
Chloride: 101 mmol/L (ref 98–111)
Creatinine, Ser: 0.79 mg/dL (ref 0.44–1.00)
GFR, Estimated: >60 mL/min (ref >60)
Glucose, Bld: 100 mg/dL — ABNORMAL HIGH (ref 70–99)
Potassium: 4.1 mmol/L (ref 3.5–5.1)
Sodium: 138 mmol/L (ref 135–145)

## 2023-01-21 LAB — CBC
HCT: 41.9 % (ref 36.0–46.0)
Hemoglobin: 13.7 g/dL (ref 12.0–15.0)
MCH: 30.6 pg (ref 26.0–34.0)
MCHC: 32.7 g/dL (ref 30.0–36.0)
MCV: 93.5 fL (ref 80.0–100.0)
Platelets: 242 10*3/uL (ref 150–400)
RBC: 4.48 MIL/uL (ref 3.87–5.11)
RDW: 13.1 % (ref 11.5–15.5)
WBC: 4.5 10*3/uL (ref 4.0–10.5)
nRBC: 0 % (ref 0.0–0.2)

## 2023-01-21 NOTE — ED Triage Notes (Signed)
Pt sts that her family attempted to wake her up today and she was difficult to wake up. Family sts that pt has slurred speech. Pt was able to talk in clear sentences. Pt sts that her difficulty to wake and fatigue has been going on for the last two full days.

## 2023-01-21 NOTE — ED Triage Notes (Signed)
First nurse note: pt to ED with family, family reports pt had to be woken up at 1200 today and noticed slurred speech and difficulty walking when woke up. Went to bed at 2000-2100 last night, LKW same.  Reports pt is also disoriented and having HTN

## 2023-06-12 DIAGNOSIS — E034 Atrophy of thyroid (acquired): Secondary | ICD-10-CM | POA: Diagnosis not present

## 2023-06-19 DIAGNOSIS — Z0000 Encounter for general adult medical examination without abnormal findings: Secondary | ICD-10-CM | POA: Diagnosis not present

## 2023-06-19 DIAGNOSIS — M810 Age-related osteoporosis without current pathological fracture: Secondary | ICD-10-CM | POA: Diagnosis not present

## 2023-06-19 DIAGNOSIS — D509 Iron deficiency anemia, unspecified: Secondary | ICD-10-CM | POA: Diagnosis not present

## 2023-06-19 DIAGNOSIS — E7800 Pure hypercholesterolemia, unspecified: Secondary | ICD-10-CM | POA: Diagnosis not present

## 2023-06-19 DIAGNOSIS — Z1331 Encounter for screening for depression: Secondary | ICD-10-CM | POA: Diagnosis not present

## 2023-06-19 DIAGNOSIS — F411 Generalized anxiety disorder: Secondary | ICD-10-CM | POA: Diagnosis not present

## 2023-06-19 DIAGNOSIS — F32A Depression, unspecified: Secondary | ICD-10-CM | POA: Diagnosis not present

## 2023-06-19 DIAGNOSIS — E034 Atrophy of thyroid (acquired): Secondary | ICD-10-CM | POA: Diagnosis not present

## 2023-06-19 DIAGNOSIS — Z0001 Encounter for general adult medical examination with abnormal findings: Secondary | ICD-10-CM | POA: Diagnosis not present

## 2023-08-30 DIAGNOSIS — R42 Dizziness and giddiness: Secondary | ICD-10-CM | POA: Diagnosis not present

## 2023-08-30 DIAGNOSIS — R509 Fever, unspecified: Secondary | ICD-10-CM | POA: Diagnosis not present

## 2023-08-30 DIAGNOSIS — N390 Urinary tract infection, site not specified: Secondary | ICD-10-CM | POA: Diagnosis not present

## 2023-08-30 DIAGNOSIS — R399 Unspecified symptoms and signs involving the genitourinary system: Secondary | ICD-10-CM | POA: Diagnosis not present

## 2023-10-02 ENCOUNTER — Other Ambulatory Visit: Payer: Self-pay

## 2023-10-02 ENCOUNTER — Emergency Department
Admission: EM | Admit: 2023-10-02 | Discharge: 2023-10-02 | Disposition: A | Discharge status: Home / Self Care | Attending: Emergency Medicine | Admitting: Emergency Medicine

## 2023-10-02 ENCOUNTER — Emergency Department

## 2023-10-02 ENCOUNTER — Encounter: Payer: Self-pay | Admitting: Emergency Medicine

## 2023-10-02 DIAGNOSIS — S065X0A Traumatic subdural hemorrhage without loss of consciousness, initial encounter: Secondary | ICD-10-CM | POA: Insufficient documentation

## 2023-10-02 DIAGNOSIS — I700 Atherosclerosis of aorta: Secondary | ICD-10-CM | POA: Diagnosis not present

## 2023-10-02 DIAGNOSIS — M5030 Other cervical disc degeneration, unspecified cervical region: Secondary | ICD-10-CM | POA: Diagnosis not present

## 2023-10-02 DIAGNOSIS — S0990XA Unspecified injury of head, initial encounter: Secondary | ICD-10-CM | POA: Diagnosis present

## 2023-10-02 DIAGNOSIS — S0083XA Contusion of other part of head, initial encounter: Secondary | ICD-10-CM | POA: Diagnosis not present

## 2023-10-02 DIAGNOSIS — S61412A Laceration without foreign body of left hand, initial encounter: Secondary | ICD-10-CM | POA: Insufficient documentation

## 2023-10-02 DIAGNOSIS — S51812A Laceration without foreign body of left forearm, initial encounter: Secondary | ICD-10-CM | POA: Insufficient documentation

## 2023-10-02 DIAGNOSIS — I10 Essential (primary) hypertension: Secondary | ICD-10-CM | POA: Diagnosis not present

## 2023-10-02 DIAGNOSIS — I6201 Nontraumatic acute subdural hemorrhage: Secondary | ICD-10-CM | POA: Diagnosis not present

## 2023-10-02 DIAGNOSIS — M542 Cervicalgia: Secondary | ICD-10-CM | POA: Insufficient documentation

## 2023-10-02 DIAGNOSIS — Z23 Encounter for immunization: Secondary | ICD-10-CM | POA: Diagnosis not present

## 2023-10-02 DIAGNOSIS — S0181XA Laceration without foreign body of other part of head, initial encounter: Secondary | ICD-10-CM | POA: Diagnosis not present

## 2023-10-02 DIAGNOSIS — W19XXXA Unspecified fall, initial encounter: Secondary | ICD-10-CM | POA: Diagnosis not present

## 2023-10-02 DIAGNOSIS — S0591XA Unspecified injury of right eye and orbit, initial encounter: Secondary | ICD-10-CM | POA: Diagnosis not present

## 2023-10-02 DIAGNOSIS — S066X0A Traumatic subarachnoid hemorrhage without loss of consciousness, initial encounter: Secondary | ICD-10-CM | POA: Diagnosis not present

## 2023-10-02 DIAGNOSIS — S065XAA Traumatic subdural hemorrhage with loss of consciousness status unknown, initial encounter: Secondary | ICD-10-CM

## 2023-10-02 DIAGNOSIS — Z043 Encounter for examination and observation following other accident: Secondary | ICD-10-CM | POA: Diagnosis not present

## 2023-10-02 MED ORDER — TETANUS-DIPHTH-ACELL PERTUSSIS 5-2.5-18.5 LF-MCG/0.5 IM SUSY
0.5000 mL | PREFILLED_SYRINGE | Freq: Once | INTRAMUSCULAR | Status: AC
  Administered 2023-10-02: 0.5 mL via INTRAMUSCULAR
  Filled 2023-10-02: qty 0.5
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
## 2023-10-02 NOTE — ED Notes (Signed)
Pt ambulated to restroom and back with assistance.  

## 2023-10-02 NOTE — ED Triage Notes (Addendum)
 Pt arrives POV w/ daughter who reports pt fell this morning going to bathroom. Pt reports hitting head of leftside, unsure of LOC, denies thinners. Skintear noted to left hand. Pt reports taking one of her anxiety pills prior to going to bed.

## 2023-10-02 NOTE — ED Notes (Signed)
 Pt verbalizes understanding of discharge instructions. Opportunity for questioning and answers were provided. Pt discharged from ED with family.

## 2023-10-02 NOTE — ED Provider Notes (Signed)
 [facility] Provider Note    Event Date/Time   First MD Initiated Contact with Patient 10/02/23 0408     (approximate)   History   Chief Complaint: Fall   HPI  Cynthia Mcgrath is a 83 y.o. female with a history of GERD, hypertension who fell while walking back to bed from the bathroom this morning.  Denies dizziness, denies any preceding pain palpitations or other acute symptoms.  Afterward has pain at the left forehead and left cheekbone.  Also complains of some neck pain and pain in the left hand where she sustained a wound.  Unsure when her last tetanus shot was.        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   Sigmoid diverticulosis    Valvular heart disease     Current Outpatient Rx   Order #: [id number] Class: Historical Med   Order #: [id number] Class: Historical Med   Order #: [id number] Class: Historical Med   Order #: [id number] Class: Historical Med   Order #: [id number] Class: OTC   Order #: [id number] Class: Historical Med   Order #: [id number] Class: Historical Med   Order #: [id number] Class: Historical Med    Past Surgical History:  Procedure Laterality Date   ACOUSTIC MENINGIOMA RESECTION Right 04/2004   APPENDECTOMY     BREAST EXCISIONAL BIOPSY Bilateral 1980's   NEG   BREAST SURGERY     CATARACT EXTRACTION, BILATERAL Bilateral    COLONOSCOPY     COLONOSCOPY WITH PROPOFOL  N/A 01/12/2020   Procedure: COLONOSCOPY WITH PROPOFOL ;  Surgeon: Jinny Carmine, MD;  Location:  [facility] ENDOSCOPY;  Service: Endoscopy;  Laterality: N/A;   COLONOSCOPY WITH PROPOFOL  N/A 11/16/2021   Procedure: COLONOSCOPY WITH PROPOFOL ;  Surgeon: Jinny Carmine, MD;  Location: [facility] ENDOSCOPY;  Service: Endoscopy;  Laterality: N/A;   ESOPHAGOGASTRODUODENOSCOPY (EGD) WITH PROPOFOL  N/A 01/12/2020   Procedure: ESOPHAGOGASTRODUODENOSCOPY (EGD) WITH PROPOFOL ;  Surgeon: Jinny Carmine, MD;  Location: [facility] ENDOSCOPY;  Service: Endoscopy;  Laterality: N/A;   EYE SURGERY     HEMORROIDECTOMY     IMPLANTATION VOCAL CORD Right    removal of brain tumor     SHOULDER ARTHROSCOPY Right    rotator cuff repair   THYROID  GROWTH REMOVAL  1985    Physical Exam   Triage Vital Signs: ED Triage Vitals  Encounter Vitals Group     BP 10/02/23 0410 (!) 164/76     Girls Systolic BP Percentile --      Girls Diastolic BP Percentile --      Boys Systolic BP Percentile --      Boys Diastolic BP Percentile --      Pulse Rate 10/02/23 0410 72     Resp 10/02/23 0410 18     Temp 10/02/23 0410 98.6 F (37 C)     Temp Source 10/02/23 0410 Oral     SpO2 10/02/23 0410 100 %     Weight 10/02/23 0408 105  lb (47.6 kg)     Height 10/02/23 0408 5' 2 (1.575 m)     Head Circumference --      Peak Flow --      Pain Score 10/02/23 0407 5     Pain Loc --      Pain Education --      Exclude from Growth Chart --     Most recent vital signs: Vitals:   10/02/23 0410  BP: (!) 164/76  Pulse: 72  Resp: 18  Temp: 98.6 F (37 C)  SpO2: 100%    General: Awake, no distress.  CV:  Good peripheral perfusion.  Regular rate rhythm Resp:  Normal effort.  Clear lungs Abd:  No distention.  Soft nontender Other:  Moist oral mucosa.  There is a 3 cm linear laceration on the left forearm, hemostatic.  1 cm skin tear on left dorsal hand.   ED Results / Procedures / Treatments   Labs (all labs ordered are listed, but only abnormal results are displayed) Labs Reviewed - No data to display   EKG    RADIOLOGY CT head  interpreted by me, shows a small left-sided subdural hematoma.  Radiology report reviewed and findings discussed with radiologist. CT cervical spine and maxillofacial negative for fractures   PROCEDURES:  .Laceration Repair  Date/Time: 10/02/2023 6:56 AM  Performed by: Viviann Pastor, MD Authorized by: Viviann Pastor, MD   Consent:    Consent obtained:  Verbal   Consent given by:  Patient   Risks discussed:  Infection and poor wound healing   Alternatives discussed:  No treatment Universal protocol:    Patient identity confirmed:  Verbally with patient and arm band Laceration details:    Location:  Shoulder/arm   Shoulder/arm location:  L lower arm   Length (cm):  4 Pre-procedure details:    Preparation:  Patient was prepped and draped in usual sterile fashion Exploration:    Wound extent: fascia not violated, no signs of injury, no nerve damage, no tendon damage, no underlying fracture and no vascular damage   Treatment:    Area cleansed with:  Povidone-iodine and saline Skin repair:    Repair method:  Steri-Strips and tissue adhesive Approximation:    Approximation:  Close Repair type:    Repair type:  Simple Post-procedure details:    Dressing:  Sterile dressing   Procedure completion:  Tolerated well, no immediate complications    MEDICATIONS ORDERED IN ED: Medications  Tdap (BOOSTRIX) injection 0.5 mL (0.5 mLs Intramuscular Given 10/02/23 0452)     IMPRESSION / MDM / ASSESSMENT AND PLAN / ED COURSE  I reviewed the triage vital signs and the nursing notes.  DDx: Intracranial hemorrhage, C-spine fracture, maxillofacial fracture, contusions  Patient's presentation is most consistent with acute presentation with potential threat to life or bodily function.  Patient presents with head trauma after mechanical fall at home.  Also has some wounds on the left hand and forearm.  Long bones are stable without deformity or evidence of fracture.  Joints have full  range of motion.  Will obtain CT imaging of head and neck, repair wounds.    ----------------------------------------- 6:56 AM on 10/02/2023 ----------------------------------------- Imaging reveals small 2 mm subdural hematoma. mBIG 1 classification.  Discussed with neurosurgery Dr. Claudene who recommends 6-hour repeat CT head to assess stability.  Patient remains at baseline mental status.  Skin tear on left hand repaired with Steri-Strips.  Laceration on left forearm repaired with Dermabond and Steri-Strips.  FINAL CLINICAL IMPRESSION(S) / ED DIAGNOSES   Final diagnoses:  SDH (subdural hematoma) (HCC)  Contusion of face, initial encounter     Rx / DC Orders   ED Discharge Orders     None        Note:  This document was prepared using Dragon voice recognition software and may include unintentional dictation errors.   Viviann Pastor, MD 10/02/23 [phone]

## 2023-10-02 NOTE — ED Notes (Signed)
 Patient transported to CT

## 2023-10-02 NOTE — ED Provider Notes (Signed)
 Patient received in signout from Dr. Viviann.  Small traumatic subdural without increased interval size on repeat CT head.  I discussed the case with neurosurgery who reviews these repeat images and agrees that patient is suitable for outpatient management.  Discussed ED return precautions.     Claudene Rover, MD 10/02/23 [phone]

## 2023-10-03 ENCOUNTER — Other Ambulatory Visit: Payer: Self-pay | Admitting: Family Medicine

## 2023-10-03 ENCOUNTER — Telehealth: Payer: Self-pay | Admitting: Neurosurgery

## 2023-10-03 DIAGNOSIS — S065XAA Traumatic subdural hemorrhage with loss of consciousness status unknown, initial encounter: Secondary | ICD-10-CM

## 2023-10-03 NOTE — Telephone Encounter (Signed)
-----   Message from Penne LELON Sharps sent at 10/02/2023 12:01 PM EDT ----- Regarding: clinic f/u [facility]: [facility] Timeline: 2-4 weeks Tests to order: non con head ct

## 2023-10-03 NOTE — Telephone Encounter (Signed)
 Spoke with pt daughter, will cb.

## 2023-10-03 NOTE — Telephone Encounter (Signed)
 Clinic[facility]  Timeline: 2-4 weeks  Tests to order: non con head ct

## 2023-10-08 ENCOUNTER — Ambulatory Visit
Admission: RE | Admit: 2023-10-08 | Discharge: 2023-10-08 | Disposition: A | Discharge status: Home / Self Care | Source: Ambulatory Visit | Attending: Orthopedic Surgery | Admitting: Orthopedic Surgery

## 2023-10-08 DIAGNOSIS — S065XAA Traumatic subdural hemorrhage with loss of consciousness status unknown, initial encounter: Secondary | ICD-10-CM | POA: Diagnosis not present

## 2023-10-08 DIAGNOSIS — I629 Nontraumatic intracranial hemorrhage, unspecified: Secondary | ICD-10-CM | POA: Diagnosis not present

## 2023-10-08 DIAGNOSIS — I6200 Nontraumatic subdural hemorrhage, unspecified: Secondary | ICD-10-CM | POA: Diagnosis not present

## 2023-10-08 DIAGNOSIS — I609 Nontraumatic subarachnoid hemorrhage, unspecified: Secondary | ICD-10-CM | POA: Diagnosis not present

## 2023-10-15 ENCOUNTER — Ambulatory Visit: Admitting: Physician Assistant

## 2023-10-15 DIAGNOSIS — Z23 Encounter for immunization: Secondary | ICD-10-CM | POA: Diagnosis not present
